# Patient Record
Sex: Female | Born: 1980 | Race: White | Hispanic: No | Marital: Married | Smoking: Never smoker
Health system: Southern US, Community
[De-identification: ages and names within clinical notes are randomized; demographics above are authoritative.]

## PROBLEM LIST (undated history)

## (undated) DIAGNOSIS — C4491 Basal cell carcinoma of skin, unspecified: Secondary | ICD-10-CM

## (undated) HISTORY — PX: MOUTH SURGERY: SHX715

---

## 2013-07-07 ENCOUNTER — Encounter: Payer: Self-pay | Admitting: Emergency Medicine

## 2013-07-07 ENCOUNTER — Emergency Department
Admission: EM | Admit: 2013-07-07 | Discharge: 2013-07-07 | Disposition: A | Discharge status: Home / Self Care | Payer: BC Managed Care – PPO | Source: Home / Self Care | Attending: Emergency Medicine | Admitting: Emergency Medicine

## 2013-07-07 ENCOUNTER — Emergency Department (INDEPENDENT_AMBULATORY_CARE_PROVIDER_SITE_OTHER): Payer: BC Managed Care – PPO

## 2013-07-07 DIAGNOSIS — S338XXA Sprain of other parts of lumbar spine and pelvis, initial encounter: Secondary | ICD-10-CM

## 2013-07-07 DIAGNOSIS — M545 Low back pain, unspecified: Secondary | ICD-10-CM

## 2013-07-07 DIAGNOSIS — S39012A Strain of muscle, fascia and tendon of lower back, initial encounter: Secondary | ICD-10-CM

## 2013-07-07 HISTORY — DX: Basal cell carcinoma of skin, unspecified: C44.91

## 2013-07-07 MED ORDER — MELOXICAM 7.5 MG PO TABS: ORAL_TABLET | ORAL | Status: DC

## 2013-07-07 MED ORDER — CYCLOBENZAPRINE HCL 5 MG PO TABS: ORAL_TABLET | ORAL | Status: DC

## 2013-07-07 NOTE — ED Provider Notes (Signed)
CSN: [id number]     Arrival date & time 07/07/13  1205 History   First MD Initiated Contact with Patient 07/07/13 1212     Chief Complaint  Patient presents with  . Tailbone Pain     (Consider location/radiation/quality/duration/timing/severity/associated sxs/prior Treatment) Patient is a 33 y.o. female presenting with back pain. The history is provided by the patient.  Back Pain Location:  Lumbar spine ("Tailbone") Quality:  Stabbing and shooting Radiates to:  Does not radiate Pain severity now: Varies. Between 2/10, maximum 7/10 with movement, flexion, Onset quality:  Unable to specify Duration:  3 days Timing:  Intermittent Progression:  Worsening Chronicity:  New (However, has had similar intermittent episodes of back pain for the past 6 years, occurring about once a year or so, always resolving within a week with conservative care) Context: not emotional stress, not MVA and not recent illness   Context comment:  She and husband moved to a new house about a month ago, and she had been doing some lifting of boxes Relieved by: rest. Worsened by:  Bending (And weightbearing on left leg) Ineffective treatments: Aleve OTC. Associated symptoms: no abdominal pain, no abdominal swelling, no bladder incontinence, no bowel incontinence, no chest pain, no dysuria, no fever, no headaches, no leg pain, no numbness, no paresthesias, no pelvic pain, no perianal numbness, no tingling, no weakness and no weight loss   Risk factors: no hx of osteoporosis, not pregnant and no recent surgery     Past Medical History  Diagnosis Date  . Basal cell carcinoma    Past Surgical History  Procedure Laterality Date  . Mouth surgery     Family History  Problem Relation Age of Onset  . Cancer Father     skin CA   History  Substance Use Topics  . Smoking status: Never Smoker   . Smokeless tobacco: Never Used  . Alcohol Use: Yes   OB History   Grav Para Term Preterm Abortions TAB SAB Ect Mult  Living                 Review of Systems  Constitutional: Negative for fever and weight loss.  Cardiovascular: Negative for chest pain.  Gastrointestinal: Negative for abdominal pain and bowel incontinence.  Genitourinary: Negative for bladder incontinence, dysuria and pelvic pain.  Musculoskeletal: Positive for back pain.  Neurological: Negative for tingling, weakness, numbness, headaches and paresthesias.  All other systems reviewed and are negative.      Allergies  Review of patient's allergies indicates no known allergies.  Home Medications  No current outpatient prescriptions on file. BP 120/78  Pulse 80  Resp 14  Ht 5\' 5"  (1.651 m)  Wt 144 lb (65.318 kg)  BMI 23.96 kg/m2  SpO2 100%  LMP 07/06/2013 Physical Exam  Nursing note and vitals reviewed. Constitutional: She is oriented to person, place, and time. She appears well-developed and well-nourished. She is cooperative.  Non-toxic appearance. She appears distressed (Appears uncomfortable from low back pain.).  HENT:  Head: Normocephalic and atraumatic.  Mouth/Throat: Oropharynx is clear and moist.  Eyes: EOM are normal. Pupils are equal, round, and reactive to light. No scleral icterus.  Neck: Neck supple.  Cardiovascular: Regular rhythm and normal heart sounds.   Pulmonary/Chest: Effort normal and breath sounds normal. No respiratory distress. She has no wheezes. She has no rales. She exhibits no tenderness.  Abdominal: Soft. There is no tenderness.  Musculoskeletal:       Right hip: Normal.  Left hip: Normal.       Cervical back: She exhibits no tenderness.       Thoracic back: She exhibits no tenderness.       Lumbar back: She exhibits decreased range of motion, tenderness, bony tenderness (L4, L5, S1, and coccyx) and spasm. She exhibits no swelling, no edema, no deformity, no laceration and normal pulse.  Negative Right straight leg-raise test. Positive Left straight leg-raise test at 45  degrees  Negative Right Saralyn Pilar test. Negative Left Saralyn Pilar test.    Neurological: She is alert and oriented to person, place, and time. She has normal strength. She displays no atrophy, no tremor and normal reflexes. No cranial nerve deficit or sensory deficit. She exhibits normal muscle tone. Gait normal.  Reflex Scores:      Patellar reflexes are 2+ on the right side and 2+ on the left side.      Achilles reflexes are 2+ on the right side and 2+ on the left side. Skin: Skin is warm, dry and intact. No lesion and no rash noted.  Psychiatric: She has a normal mood and affect.    ED Course  Procedures (including critical care time) Labs Review Labs Reviewed - No data to display Imaging Review Dg Lumbar Spine Complete  07/07/2013   CLINICAL DATA:  33 year old female with low back pain radiating to the buttocks. Initial encounter.  EXAM: LUMBAR SPINE - COMPLETE 4+ VIEW  COMPARISON:  None.  FINDINGS: Normal lumbar segmentation. Bone mineralization is within normal limits. Normal vertebral height and alignment. Relatively preserved disc spaces. No pars fracture. Visible lower thoracic levels appear intact. sacral ala and SI joints within normal limits.  IMPRESSION: Negative.   Electronically Signed   By: Lars Pinks M.D.   On: 07/07/2013 13:41   Dg Sacrum/coccyx  07/07/2013   CLINICAL DATA:  33 year old female with pain radiating to the buttocks. Initial encounter.  EXAM: SACRUM AND COCCYX - 2+ VIEW  COMPARISON:  Lumbar radiographs from the same day reported separately.  FINDINGS: Bone mineralization is within normal limits. Sacral ala and SI joints appear intact and within normal limits. On the lateral view the sacrum appears intact. Coccygeal segments appear within normal limits. Femoral heads are normally located and both hip joint spaces are preserved. Visible pelvis appears intact  IMPRESSION: Negative.   Electronically Signed   By: Lars Pinks M.D.   On: 07/07/2013 13:42      MDM   Final  diagnoses:  None    Reviewed x-rays lumbar spine and sacrum/coccyx, all within normal limits.--Reviewed in detail with patient. Gave her copies of x-ray reports.  She likely has musculoligamentous strain of LS-spine and coccygeal area. Neurologic exam normal. The only positive finding is positive left straight leg raise. She may also have left radicular pain, although no radiation reported. Treatment options discussed, as well as risks, benefits, alternatives. Patient voiced understanding and agreement with the following plans: Mobic 7.5 mg twice a day Flexeril 5 mg every 8 hours when necessary muscle relaxant Other symptomatic care and nonpharmacologic treatment discussed Followup with PCP or orthopedist if no better one week, sooner if worse or new symptoms. Precautions discussed. Red flags discussed. Questions invited and answered. Patient voiced understanding and agreement.      Jacqulyn Cane, MD 07/07/13 1455

## 2013-07-07 NOTE — ED Notes (Signed)
Shirley Hanson c/o lower back/tailbone pain x 2-3 days. She recently moved. She reports she has pain in the same location intermittent x 6 years, brought on by various activities. Pain does not radiate. Taken Aleve without relief.

## 2013-09-08 LAB — OB RESULTS CONSOLE GC/CHLAMYDIA
CHLAMYDIA, DNA PROBE: NEGATIVE
Chlamydia: NEGATIVE
GC PROBE AMP, GENITAL: NEGATIVE
Gonorrhea: NEGATIVE

## 2013-09-08 LAB — OB RESULTS CONSOLE ABO/RH: RH Type: POSITIVE

## 2013-09-08 LAB — OB RESULTS CONSOLE HEPATITIS B SURFACE ANTIGEN: Hepatitis B Surface Ag: NEGATIVE

## 2013-09-08 LAB — OB RESULTS CONSOLE ANTIBODY SCREEN: ANTIBODY SCREEN: NEGATIVE

## 2013-09-08 LAB — OB RESULTS CONSOLE RPR
RPR: NONREACTIVE
RPR: NONREACTIVE

## 2013-09-08 LAB — OB RESULTS CONSOLE HIV ANTIBODY (ROUTINE TESTING)
HIV: NONREACTIVE
HIV: NONREACTIVE

## 2013-09-08 LAB — OB RESULTS CONSOLE RUBELLA ANTIBODY, IGM: Rubella: IMMUNE

## 2014-03-04 LAB — OB RESULTS CONSOLE GBS: STREP GROUP B AG: NEGATIVE

## 2014-03-27 ENCOUNTER — Inpatient Hospital Stay (HOSPITAL_COMMUNITY)
Admission: AD | Admit: 2014-03-27 | Discharge: 2014-03-29 | DRG: 775 | Disposition: A | Discharge status: Home / Self Care | Payer: BC Managed Care – PPO | Source: Ambulatory Visit | Attending: Obstetrics and Gynecology | Admitting: Obstetrics and Gynecology

## 2014-03-27 ENCOUNTER — Encounter (HOSPITAL_COMMUNITY): Payer: Self-pay

## 2014-03-27 DIAGNOSIS — O09513 Supervision of elderly primigravida, third trimester: Secondary | ICD-10-CM

## 2014-03-27 DIAGNOSIS — O471 False labor at or after 37 completed weeks of gestation: Secondary | ICD-10-CM | POA: Diagnosis present

## 2014-03-27 DIAGNOSIS — O4290 Premature rupture of membranes, unspecified as to length of time between rupture and onset of labor, unspecified weeks of gestation: Secondary | ICD-10-CM | POA: Diagnosis present

## 2014-03-27 DIAGNOSIS — Z3A38 38 weeks gestation of pregnancy: Secondary | ICD-10-CM | POA: Diagnosis present

## 2014-03-27 LAB — ABO/RH: ABO/RH(D): O POS

## 2014-03-27 LAB — CBC
HEMATOCRIT: 38.7 % (ref 36.0–46.0)
Hemoglobin: 13.3 g/dL (ref 12.0–15.0)
MCH: 29.3 pg (ref 26.0–34.0)
MCHC: 34.4 g/dL (ref 30.0–36.0)
MCV: 85.2 fL (ref 78.0–100.0)
Platelets: 181 10*3/uL (ref 150–400)
RBC: 4.54 MIL/uL (ref 3.87–5.11)
RDW: 14 % (ref 11.5–15.5)
WBC: 12.3 10*3/uL — AB (ref 4.0–10.5)

## 2014-03-27 LAB — RPR

## 2014-03-27 LAB — TYPE AND SCREEN
ABO/RH(D): O POS
ANTIBODY SCREEN: NEGATIVE

## 2014-03-27 MED ORDER — PRENATAL MULTIVITAMIN CH
1.0000 | ORAL_TABLET | Freq: Every day | ORAL | Status: DC
Start: 2014-03-28 — End: 2014-03-29
  Administered 2014-03-28 – 2014-03-29 (×2): 1 via ORAL
  Filled 2014-03-27 (×2): qty 1

## 2014-03-27 MED ORDER — FLEET ENEMA 7-19 GM/118ML RE ENEM: 1.0000 | ENEMA | Freq: Every day | RECTAL | Status: DC | PRN

## 2014-03-27 MED ORDER — DIPHENHYDRAMINE HCL 50 MG/ML IJ SOLN: 12.5000 mg | INTRAMUSCULAR | Status: DC | PRN

## 2014-03-27 MED ORDER — ONDANSETRON HCL 4 MG/2ML IJ SOLN: 4.0000 mg | INTRAMUSCULAR | Status: DC | PRN

## 2014-03-27 MED ORDER — WITCH HAZEL-GLYCERIN EX PADS
1.0000 "application " | MEDICATED_PAD | CUTANEOUS | Status: DC | PRN
  Administered 2014-03-28: 1 via TOPICAL

## 2014-03-27 MED ORDER — PHENYLEPHRINE 40 MCG/ML (10ML) SYRINGE FOR IV PUSH (FOR BLOOD PRESSURE SUPPORT)
80.0000 ug | PREFILLED_SYRINGE | INTRAVENOUS | Status: DC | PRN
  Filled 2014-03-27: qty 2
  Filled 2014-03-27: qty 10

## 2014-03-27 MED ORDER — IBUPROFEN 600 MG PO TABS
600.0000 mg | ORAL_TABLET | Freq: Four times a day (QID) | ORAL | Status: DC
  Administered 2014-03-27 – 2014-03-29 (×8): 600 mg via ORAL
  Filled 2014-03-27 (×8): qty 1

## 2014-03-27 MED ORDER — SIMETHICONE 80 MG PO CHEW: 80.0000 mg | CHEWABLE_TABLET | ORAL | Status: DC | PRN

## 2014-03-27 MED ORDER — EPHEDRINE 5 MG/ML INJ
10.0000 mg | INTRAVENOUS | Status: DC | PRN
  Filled 2014-03-27: qty 2

## 2014-03-27 MED ORDER — BENZOCAINE-MENTHOL 20-0.5 % EX AERO
1.0000 "application " | INHALATION_SPRAY | CUTANEOUS | Status: DC | PRN
  Administered 2014-03-27: 1 via TOPICAL
  Filled 2014-03-27: qty 56

## 2014-03-27 MED ORDER — OXYTOCIN BOLUS FROM INFUSION: 500.0000 mL | INTRAVENOUS | Status: DC

## 2014-03-27 MED ORDER — LIDOCAINE HCL (PF) 1 % IJ SOLN
30.0000 mL | INTRAMUSCULAR | Status: AC | PRN
  Administered 2014-03-27: 30 mL via SUBCUTANEOUS
  Filled 2014-03-27: qty 30

## 2014-03-27 MED ORDER — ONDANSETRON HCL 4 MG/2ML IJ SOLN: 4.0000 mg | Freq: Four times a day (QID) | INTRAMUSCULAR | Status: DC | PRN

## 2014-03-27 MED ORDER — ONDANSETRON HCL 4 MG PO TABS: 4.0000 mg | ORAL_TABLET | ORAL | Status: DC | PRN

## 2014-03-27 MED ORDER — OXYCODONE-ACETAMINOPHEN 5-325 MG PO TABS: 1.0000 | ORAL_TABLET | ORAL | Status: DC | PRN

## 2014-03-27 MED ORDER — BISACODYL 10 MG RE SUPP: 10.0000 mg | Freq: Every day | RECTAL | Status: DC | PRN

## 2014-03-27 MED ORDER — TETANUS-DIPHTH-ACELL PERTUSSIS 5-2.5-18.5 LF-MCG/0.5 IM SUSP: 0.5000 mL | Freq: Once | INTRAMUSCULAR | Status: DC

## 2014-03-27 MED ORDER — LACTATED RINGERS IV SOLN: 500.0000 mL | Freq: Once | INTRAVENOUS | Status: DC

## 2014-03-27 MED ORDER — OXYTOCIN 40 UNITS IN LACTATED RINGERS INFUSION - SIMPLE MED
62.5000 mL/h | INTRAVENOUS | Status: DC
  Administered 2014-03-27: 62.5 mL/h via INTRAVENOUS
  Filled 2014-03-27: qty 1000

## 2014-03-27 MED ORDER — SENNOSIDES-DOCUSATE SODIUM 8.6-50 MG PO TABS
2.0000 | ORAL_TABLET | ORAL | Status: DC
  Administered 2014-03-27 – 2014-03-28 (×2): 2 via ORAL
  Filled 2014-03-27 (×2): qty 2

## 2014-03-27 MED ORDER — FENTANYL 2.5 MCG/ML BUPIVACAINE 1/10 % EPIDURAL INFUSION (WH - ANES)
14.0000 mL/h | INTRAMUSCULAR | Status: DC | PRN
  Filled 2014-03-27: qty 125

## 2014-03-27 MED ORDER — ACETAMINOPHEN 325 MG PO TABS: 650.0000 mg | ORAL_TABLET | ORAL | Status: DC | PRN

## 2014-03-27 MED ORDER — PHENYLEPHRINE 40 MCG/ML (10ML) SYRINGE FOR IV PUSH (FOR BLOOD PRESSURE SUPPORT)
80.0000 ug | PREFILLED_SYRINGE | INTRAVENOUS | Status: DC | PRN
  Filled 2014-03-27: qty 2

## 2014-03-27 MED ORDER — LANOLIN HYDROUS EX OINT: TOPICAL_OINTMENT | CUTANEOUS | Status: DC | PRN

## 2014-03-27 MED ORDER — LACTATED RINGERS IV SOLN: 500.0000 mL | INTRAVENOUS | Status: DC | PRN

## 2014-03-27 MED ORDER — LACTATED RINGERS IV SOLN: INTRAVENOUS | Status: DC

## 2014-03-27 MED ORDER — OXYCODONE-ACETAMINOPHEN 5-325 MG PO TABS: 2.0000 | ORAL_TABLET | ORAL | Status: DC | PRN

## 2014-03-27 MED ORDER — ZOLPIDEM TARTRATE 5 MG PO TABS: 5.0000 mg | ORAL_TABLET | Freq: Every evening | ORAL | Status: DC | PRN

## 2014-03-27 MED ORDER — FLEET ENEMA 7-19 GM/118ML RE ENEM: 1.0000 | ENEMA | RECTAL | Status: DC | PRN

## 2014-03-27 MED ORDER — CITRIC ACID-SODIUM CITRATE 334-500 MG/5ML PO SOLN: 30.0000 mL | ORAL | Status: DC | PRN

## 2014-03-27 MED ORDER — DIBUCAINE 1 % RE OINT
1.0000 "application " | TOPICAL_OINTMENT | RECTAL | Status: DC | PRN
  Administered 2014-03-28: 1 via RECTAL
  Filled 2014-03-27: qty 28

## 2014-03-27 MED ORDER — DIPHENHYDRAMINE HCL 25 MG PO CAPS: 25.0000 mg | ORAL_CAPSULE | Freq: Four times a day (QID) | ORAL | Status: DC | PRN

## 2014-03-27 NOTE — Lactation Note (Signed)
This note was copied from the chart of Shirley Izora Benn. Lactation Consultation Note  P1, FOB is [employer] employee.  Baby will be on his insurance. Discussed parent's picking out UMR pump before discharge. Baby sleeping in mother's arms.  Encouraged STS. Reviewed hand expression with mother and discussed cluster feeding. Mom encouraged to feed baby 8-12 times/24 hours and with feeding cues.  Mom made aware of O/P services, breastfeeding support groups, community resources, and our phone # for post-discharge questions.    Patient Name: Shirley Hanson [id number] Date: 03/27/2014 Reason for consult: Initial assessment   Maternal Data Has patient been taught Hand Expression?: Yes Does the patient have breastfeeding experience prior to this delivery?: No  Feeding Feeding Type: Breast Fed Length of feed: 10 min  LATCH Score/Interventions Latch: Too sleepy or reluctant, no latch achieved, no sucking elicited. Intervention(s): Skin to skin  Audible Swallowing: None Intervention(s): Skin to skin  Type of Nipple: Everted at rest and after stimulation  Comfort (Breast/Nipple): Soft / non-tender     Hold (Positioning): Assistance needed to correctly position infant at breast and maintain latch.  LATCH Score: 5  Lactation Tools Discussed/Used     Consult Status Consult Status: Follow-up Date: 03/28/14 Follow-up type: In-patient    Shirley Hanson [facility] 03/27/2014, 4:53 PM

## 2014-03-27 NOTE — H&P (Signed)
Shirley Hanson is a 33 y.o. female presenting for UCs. SROM in MAU, clear. Maternal Medical History:  Reason for admission: Rupture of membranes and contractions.   Contractions: Onset was 3-5 hours ago.    Fetal activity: Perceived fetal activity is normal.      OB History   Grav Para Term Preterm Abortions TAB SAB Ect Mult Living   1              Past Medical History  Diagnosis Date  . Basal cell carcinoma    Past Surgical History  Procedure Laterality Date  . Mouth surgery     Family History: family history includes Cancer in her father. Social History:  reports that she has never smoked. She has never used smokeless tobacco. She reports that she drinks alcohol. She reports that she does not use illicit drugs.   Prenatal Transfer Tool  Maternal Diabetes: No Genetic Screening: Normal Maternal Ultrasounds/Referrals: Normal Fetal Ultrasounds or other Referrals:  None Maternal Substance Abuse:  No Significant Maternal Medications:  None Significant Maternal Lab Results:  None Other Comments:  None  Review of Systems  Eyes: Negative for blurred vision.  Gastrointestinal: Negative for abdominal pain.  Neurological: Negative for headaches.    Dilation: 9 Effacement (%): 90 Station: 0 Blood pressure 97/63, pulse 67, temperature 97.5 F (36.4 C), temperature source Oral, resp. rate 20, height 5\' 5"  (1.651 m), weight 170 lb (77.111 kg), last menstrual period 07/06/2013. Maternal Exam:  Uterine Assessment: Contraction strength is firm.  Contraction frequency is regular.   Abdomen: Patient reports no abdominal tenderness. Fetal presentation: vertex     Fetal Exam Fetal State Assessment: Category I - tracings are normal.     Physical Exam  Cardiovascular: Normal rate and regular rhythm.   Respiratory: Effort normal.  GI: Soft.  Neurological: She has normal reflexes.    Prenatal labs: ABO, Rh: O/Positive/-- (04/14 0000) Antibody: Negative (04/14  0000) Rubella: Immune (04/14 0000) RPR: Nonreactive, Nonreactive (04/14 0000)  HBsAg: Negative (04/14 0000)  HIV: Non-reactive, Non-reactive (04/14 0000)  GBS: Negative (10/08 0000)   Assessment/Plan: 33 yo G1P0 at 38 3/7 weeks in active labor   Valerye Kobus II,Williamson Cavanah E 03/27/2014, 9:34 AM

## 2014-03-27 NOTE — Progress Notes (Signed)
Delivery Note At 12:41 PM a viable female was delivered via  (Presentation: ;  ).  APGAR:8 ,9 ; weight .   Placenta status:intact ,3 vessels .  Cord:  with the following complications: .  Cord pH: pending  Anesthesia:   Episiotomy:none Lacerations:  second degree rt periruethral and left sulcus lacs, both repaired Suture Repair: 2.0 vicryl Est. Blood Loss (mL): 300  Mom to postpartum.  Baby to Couplet care / Skin to Skin.  Cicilia Clinger II,Chee Dimon E 03/27/2014, 12:58 PM

## 2014-03-27 NOTE — Progress Notes (Signed)
Notified pt sent down hall for ROM, gbs negative, was 3cm in office yesterday. Will see pt in room 174

## 2014-03-27 NOTE — MAU Note (Signed)
Pt states ROM at 0630, clear fluid, was 3cm in office yesterday.

## 2014-03-28 LAB — CBC
HCT: 35.8 % — ABNORMAL LOW (ref 36.0–46.0)
Hemoglobin: 12.1 g/dL (ref 12.0–15.0)
MCH: 29.4 pg (ref 26.0–34.0)
MCHC: 33.8 g/dL (ref 30.0–36.0)
MCV: 86.9 fL (ref 78.0–100.0)
PLATELETS: 195 10*3/uL (ref 150–400)
RBC: 4.12 MIL/uL (ref 3.87–5.11)
RDW: 14.2 % (ref 11.5–15.5)
WBC: 17.3 10*3/uL — ABNORMAL HIGH (ref 4.0–10.5)

## 2014-03-28 NOTE — Lactation Note (Signed)
This note was copied from the chart of Shirley Hanson. Lactation Consultation Note Follow up visit at 33 hours.  Mom reports baby is fussy with short frequent feedings.  Discussed cluster feedings and encouraged mom.  Minimal assist with latch on left breast in cross cradle hold, baby latches well with wide flanged lips and rhythmic sucking and few audible swallows then comes off fussy.  Assisted with spoon feeding EBM of about 100mls.  Baby tolerated well, then swaddled and handed to FOB to encourage mom to rest if she can.  Mom denies pain with latch.  Mom to call for assist as needed.    Patient Name: Shirley Hanson YBWLS'L Date: 03/28/2014 Reason for consult: Follow-up assessment   Maternal Data    Feeding Feeding Type: Breast Fed Length of feed:  (few minutes)  LATCH Score/Interventions Latch: Grasps breast easily, tongue down, lips flanged, rhythmical sucking. Intervention(s): Skin to skin;Teach feeding cues;Waking techniques  Audible Swallowing: A few with stimulation Intervention(s): Skin to skin;Hand expression  Type of Nipple: Everted at rest and after stimulation  Comfort (Breast/Nipple): Soft / non-tender     Hold (Positioning): Assistance needed to correctly position infant at breast and maintain latch. Intervention(s): Skin to skin;Position options;Support Pillows;Breastfeeding basics reviewed  LATCH Score: 8  Lactation Tools Discussed/Used     Consult Status Consult Status: Follow-up Date: 03/29/14 Follow-up type: In-patient    Justice Britain 03/28/2014, 10:08 PM

## 2014-03-28 NOTE — Progress Notes (Signed)
Post Partum Day 1 Subjective: no complaints, up ad lib, voiding and tolerating PO  Objective: Blood pressure 128/80, pulse 76, temperature 98.4 F (36.9 C), temperature source Oral, resp. rate 20, height 5\' 5"  (1.651 m), weight 170 lb (77.111 kg), last menstrual period 07/06/2013, unknown if currently breastfeeding.  Physical Exam:  General: alert, cooperative and no distress Lochia: appropriate Uterine Fundus: firm Incision: healing well DVT Evaluation: No evidence of DVT seen on physical exam.   Recent Labs  03/27/14 0920 03/28/14 0607  HGB 13.3 12.1  HCT 38.7 35.8*    Assessment/Plan: Plan for discharge tomorrow   LOS: 1 day   Corley Maffeo II,Shonte Beutler E 03/28/2014, 8:26 AM

## 2014-03-29 ENCOUNTER — Encounter (HOSPITAL_COMMUNITY): Payer: Self-pay

## 2014-03-29 ENCOUNTER — Ambulatory Visit: Payer: Self-pay

## 2014-03-29 LAB — CBC
HCT: 33.2 % — ABNORMAL LOW (ref 36.0–46.0)
Hemoglobin: 11.1 g/dL — ABNORMAL LOW (ref 12.0–15.0)
MCH: 29.4 pg (ref 26.0–34.0)
MCHC: 33.4 g/dL (ref 30.0–36.0)
MCV: 87.8 fL (ref 78.0–100.0)
PLATELETS: 197 10*3/uL (ref 150–400)
RBC: 3.78 MIL/uL — ABNORMAL LOW (ref 3.87–5.11)
RDW: 14.4 % (ref 11.5–15.5)
WBC: 11.7 10*3/uL — AB (ref 4.0–10.5)

## 2014-03-29 LAB — COMPREHENSIVE METABOLIC PANEL
ALT: 20 U/L (ref 0–35)
AST: 25 U/L (ref 0–37)
Albumin: 2.4 g/dL — ABNORMAL LOW (ref 3.5–5.2)
Alkaline Phosphatase: 139 U/L — ABNORMAL HIGH (ref 39–117)
Anion gap: 13 (ref 5–15)
BUN: 9 mg/dL (ref 6–23)
CO2: 22 meq/L (ref 19–32)
CREATININE: 0.51 mg/dL (ref 0.50–1.10)
Calcium: 8.3 mg/dL — ABNORMAL LOW (ref 8.4–10.5)
Chloride: 105 mEq/L (ref 96–112)
GFR calc Af Amer: >90 mL/min (ref >90)
Glucose, Bld: 113 mg/dL — ABNORMAL HIGH (ref 70–99)
POTASSIUM: 3.8 meq/L (ref 3.7–5.3)
Sodium: 140 mEq/L (ref 137–147)
Total Bilirubin: <0.2 mg/dL — ABNORMAL LOW (ref 0.3–1.2)
Total Protein: 5.4 g/dL — ABNORMAL LOW (ref 6.0–8.3)

## 2014-03-29 MED ORDER — IBUPROFEN 600 MG PO TABS: 600.0000 mg | ORAL_TABLET | Freq: Four times a day (QID) | ORAL | Status: DC

## 2014-03-29 NOTE — Discharge Summary (Signed)
Obstetric Discharge Summary Reason for Admission: onset of labor and rupture of membranes Prenatal Procedures: ultrasound Intrapartum Procedures: spontaneous vaginal delivery Postpartum Procedures: none Complications-Operative and Postpartum: right periurethral  laceration HEMOGLOBIN  Date Value Ref Range Status  03/28/2014 12.1 12.0 - 15.0 Hanson/dL Final   HCT  Date Value Ref Range Status  03/28/2014 35.8* 36.0 - 46.0 % Final    Physical Exam:  General: alert and cooperative Lochia: appropriate Uterine Fundus: firm Incision: healing well DVT Evaluation: No evidence of DVT seen on physical exam. Negative Homan's sign. No cords or calf tenderness. No significant calf/ankle edema. DTR's 2+, no clonus  Discharge Diagnoses: Term Pregnancy-delivered  Discharge Information: Date: 03/29/2014 Activity: pelvic rest Diet: routine Medications: PNV and Ibuprofen Condition: stable Instructions: refer to practice specific booklet Discharge to: home. Reviewed PIH symptoms . CBC and CMP drawn prior to discharge   Newborn Data: Live born female  Birth Weight: 7 lb 10.8 oz ([id number] Hanson) APGAR: 8, 9  Home with mother.  Shirley Hanson 03/29/2014, 8:15 AM

## 2014-03-29 NOTE — Lactation Note (Addendum)
This note was copied from the chart of Shirley Hanson. Lactation Consultation Note  Mom latched baby to the breast.  He was very sleepy and needed to be stimulated to suckle.  About 10 minutes into the feeding he became more active though he still needed some stimulation.  Swallows were heard.  Mom reports that this is how the feedings have been.  Suck training was done by the [facility].  Initially he did bit and did not pull my finger deeply into his mouth but after a minute or so I was able to advance my finger back to the hard and soft palate juncture and and he suckled well.  I discussed use of a nipple shield (#20)to see if "extending" the nipple with it would help achieve more suckling.  Baby was tired and not interested.  A #24 was also given to her because her nipple had some swelling when the baby came off.  Mom is able to apply the NS and was taught how to clean it. Plan for discharge is to prepump or hand express to start milk flow so that baby will engage sooner.  Mom may or may not use a NS.  She will post pump for 10 minutes after each feeding and feed any expressed BM back to the baby.  Pediatrician appointment tomorrow and [facility] appointment on Wed.  Patient Name: Shirley Hanson [id number] Date: 03/29/2014     Maternal Data    Feeding Feeding Type: Breast Fed Length of feed: 20 min  LATCH Score/Interventions Latch: Repeated attempts needed to sustain latch, nipple held in mouth throughout feeding, stimulation needed to elicit sucking reflex.  Audible Swallowing: A few with stimulation  Type of Nipple: Everted at rest and after stimulation  Comfort (Breast/Nipple): Soft / non-tender     Hold (Positioning): No assistance needed to correctly position infant at breast.  LATCH Score: 8  Lactation Tools Discussed/Used     Consult Status      Van Clines 03/29/2014, 1:51 PM

## 2014-03-29 NOTE — Lactation Note (Signed)
This note was copied from the chart of Shirley Hanson. Lactation Consultation Note Noted 7 % weight loss, BF well. Noted 11 voids and 11 stools which is more than adequate output for age. Monitoring weight and feedings cont. Patient Name: Shirley Hanson HWYSH'U Date: 03/29/2014 Reason for consult: Infant weight loss;Follow-up assessment   Maternal Data    Feeding Feeding Type: Breast Milk Length of feed:  (few minutes, mom reports cluster feedings previous)  LATCH Score/Interventions Latch: Grasps breast easily, tongue down, lips flanged, rhythmical sucking. Intervention(s): Skin to skin;Teach feeding cues;Waking techniques  Audible Swallowing: A few with stimulation Intervention(s): Skin to skin;Hand expression  Type of Nipple: Everted at rest and after stimulation  Comfort (Breast/Nipple): Soft / non-tender     Hold (Positioning): Assistance needed to correctly position infant at breast and maintain latch. Intervention(s): Skin to skin;Position options;Support Pillows;Breastfeeding basics reviewed  LATCH Score: 8  Lactation Tools Discussed/Used     Consult Status Consult Status: Follow-up Date: 03/29/14 Follow-up type: In-patient    Reann Dobias, Elta Guadeloupe 03/29/2014, 1:48 AM

## 2014-03-31 ENCOUNTER — Ambulatory Visit (HOSPITAL_COMMUNITY)
Admission: AD | Admit: 2014-03-31 | Discharge: 2014-03-31 | Disposition: A | Discharge status: Home / Self Care | Payer: BC Managed Care – PPO | Attending: Obstetrics and Gynecology | Admitting: Obstetrics and Gynecology

## 2014-03-31 NOTE — Lactation Note (Addendum)
Lactation Consult  Mother's reason for visit:  Feeding assessment  Visit Type: feeding assessment  Appointment Notes:   Consult:  Initial Lactation Consultant:  Myer Haff  ________________________________________________________________________ Shirley Hanson Name: Shirley Hanson Date of Birth: 03/27/2016 Pediatrician:Dr. Hope Hanson - [facility]  Gender: female Gestational Age: [redacted]w[redacted]d (At Birth) Birth Weight: 7 lb 10.8 oz (3481 g) Weight at Discharge: Weight: 7 lb 2.3 oz (3240 g)Date of Discharge: 03/29/2014 [facility] Weights   03/27/14 1241 03/28/14 0010 03/28/14 2335  Weight: 7 lb 10.8 oz (3481 g) 7 lb 8.5 oz (3416 g) 7 lb 2.3 oz (3240 g)   Last weight taken from location outside of [facility]:6-13.5 oz Location:Pediatrician's office Weight today:6-13.1 oz 3092 g         ________________________________________________________________________  Mother's Name: Shirley Hanson Type of delivery:  Vaginal Delivery  Breastfeeding Experience:  Baby sleepy in the hospital 1st 2 days , and then the baby was circ , and still went through sleepy time, also jaundice.  Per mom was given a 2 different sized nipple shields the day of discharged and I haven't needed to use them. Shirley Hanson has been latching. But I have been pumping.  Per mom  Jaundice level was done in the Dr. Gabriel Hanson and not high enough to tx with lights.   Maternal Medical Conditions:  Non related to breast  Feeding and milk supply  Maternal Medications:  PNV ,   ________________________________________________________________________  Breastfeeding History (Post Discharge)  Frequency of breastfeeding:  8 X's a day  Duration of feeding:  20 - 30 mins , sometimes sluggish   Pumping - per mom using a DEBP - Medela - pumping after every other feeding for 10 mins with 15 ml off each breast.   Supplementing- per mom supplementing with EBM in a cup after latching at the breast , when available  .   Infant Intake and Output Assessment  Voids: 7-8 , 24 hours Clear yellow Stools:  5 n 24 hrs.  Color:  Meconium and Green  ________________________________________________________________________  Maternal Breast Assessment  Breast:  Full Nipple:  Erect  Pain level:  0 Pain interventions:  Expressed breast milk   Per mom milk came in yesterday Tuesday 11/3 ,   _______________________________________________________________________ Feeding Assessment/Evaluation - Baby jaundice from face down to knees, baby alert , and well hydrated.                                                              Per mom this is is sleepy time a day.   Initial feeding assessment:  Infant's oral assessment:  WNL  Positioning:  Football Right breast  LATCH documentation:  Latch:  2 = Grasps breast easily, tongue down, lips flanged, rhythmical sucking.  Audible swallowing:  2 = Spontaneous and intermittent  Type of nipple:  2 = Everted at rest and after stimulation  Comfort (Breast/Nipple):  1 = Filling, red/small blisters or bruises, mild/mod discomfort ( full , not engorged )   Hold (Positioning):  1 = Assistance needed to correctly position infant at breast and maintain latch ( with depth )   LATCH score:  8   Attached assessment:  Shallow at 1st , and assisted with depth   Lips flanged:  No. ( LC flipped upper lip to flanged position  Lips untucked:  Yes.    Suck assessment:  Nutritive at 1st with swallows and the noted to be sluggish and requiring a lot of stimulation , breast compressions   Tools:  None  Instructed on use and cleaning of tool:  No.  Pre-feed weight:  3092  g  (6  Lb. 13.1  oz.) Post-feed weight:  3098  g (6  lb. 13.3  oz.) Amount transferred:  6 ml Amount supplemented:  None   Additional Feeding Assessment -   Infant's oral assessment:  See above   Positioning:  Football Right breast  Re-latched same breast due to breast feeling still full  LATCH  documentation:  Latch:  2 = Grasps breast easily, tongue down, lips flanged, rhythmical sucking.  Audible swallowing:  2 = Spontaneous and intermittent  Type of nipple:  2 = Everted at rest and after stimulation  Comfort (Breast/Nipple):  1 = Filling, red/small blisters or bruises, mild/mod discomfort ( just full , not engorged )   Hold (Positioning):  1 = Assistance needed to correctly position infant at breast and maintain latch with depth   LATCH score: 8 ,   Attached assessment:  Shallow at 1st , adjusted baby mouth by easing down chin and achieved the depth.   Lips flanged:  Yes.    Lips untucked:  Yes.    Suck assessment:  Nutritive for a short time with swallows , and then noted to stay latched but became non - nutritive,                                    Baby fed for 10 mins, with intermittent stimulation and breast compressions.                                    Even though there were swallows noted , not enough milk transfer to tip the scales.  Tools:  None  Instructed on use and cleaning of tool:  No   Pre-feed weight:  3098 g  (6  lb. 13.3  oz.) Post-feed weight:  3098  g ( 6  lb. 13.3  oz.) Amount transferred:  0  ml Amount supplemented:  None   Additional Feeding assessment:  Left breast   Pre weight - 3098 g, 6-13.3 oz  Post weight - 3118 g 6-14.0 oz  Milk Transferred at this feeding = 20 ml     Total amount pumped post feed:  Mom did not post pump, but has been post pumping at home after every other feeding with a DEBP   Total amount transferred:  26  ml Total supplement given:  Not at this consult ( mom has been supplementing - and knows to continue with EBM form post pumping)   Lactation Impression from consult:  Breast are full , not engorged. Baby alert but sluggish ( may be due to resolving jaundice ) , weight is stabilizing                                                                 Mom independent with latch , just needed assist and instructed  on  use of breast compressions to obtain the depth for more milk transfer.                                                                Mom and dad aware post pumping is important for volume to supplement back to baby for increased calories , therefore enhance jaundice to resolve                                                                 Quicker. ( more voids and stools )> See below for Lactation Plan of care.  Stressed to mom and dad by the end of the 1st week a baby should be taking in at one feeding at least 45 ml!   Lactation Plan of care - Per mom - Smart start - weight check Friday 11/6                                          - Monday Evening 11/9  - LC recommended attending the [facility] for weight check                                          - Wednesday 11/11 - per mom Pedis apt with Dr. Birdie Riddle                                Mom - naps, rest , plenty fluids , especially water , nutritious snacks and meals                               Sore nipple tx - Expressed milk to nipples liberally , cold comfort gels after feedings and pumping                                                        - Shells after comfort gels warm up or 10 1-15 mins prior to latch . ( Don't wear shells when sleeping)                                                        - Olive oil or coconut oil prior to pumping if needed to decrease friction                               Steps for latching -  Breast massage , hand express, prepump with hand pump 7-8 strokes ( prime the milk ducts, 1st breast )                                                                  Latch with firm support ( folded bath towel on top of your boppy at home)                                                                  Breast compressions with latch until "Shirley Hanson " is in a consistent swallowing pattern and then intermittent                               Average feeding time >10 mins  Or greater ,  15 -20 mins                               Wake  Cannon up every 2 1/2 - 3 hours                               Extra pumping - Post pumping using a DEBP both breast for 10 - 15 mins, save milk and supplement it back to [place] with a cup.

## 2014-05-18 LAB — HM PAP SMEAR: HM Pap smear: NORMAL

## 2015-02-11 ENCOUNTER — Ambulatory Visit: Payer: BC Managed Care – PPO | Admitting: Family Medicine

## 2015-05-19 ENCOUNTER — Encounter: Payer: Self-pay | Admitting: Family Medicine

## 2015-05-19 ENCOUNTER — Ambulatory Visit (INDEPENDENT_AMBULATORY_CARE_PROVIDER_SITE_OTHER): Payer: BC Managed Care – PPO | Admitting: Family Medicine

## 2015-05-19 VITALS — BP 124/82 | HR 81 | Temp 98.0°F | Resp 16 | Ht 65.0 in | Wt 144.2 lb

## 2015-05-19 DIAGNOSIS — Z0000 Encounter for general adult medical examination without abnormal findings: Secondary | ICD-10-CM

## 2015-05-19 LAB — CBC WITH DIFFERENTIAL/PLATELET
Basophils Absolute: 0 10*3/uL (ref 0.0–0.1)
Basophils Relative: 0.5 % (ref 0.0–3.0)
Eosinophils Absolute: 0.1 10*3/uL (ref 0.0–0.7)
Eosinophils Relative: 2 % (ref 0.0–5.0)
HCT: 44 % (ref 36.0–46.0)
HEMOGLOBIN: 14.7 g/dL (ref 12.0–15.0)
LYMPHS PCT: 35.1 % (ref 12.0–46.0)
Lymphs Abs: 2.1 10*3/uL (ref 0.7–4.0)
MCHC: 33.3 g/dL (ref 30.0–36.0)
MCV: 87.5 fl (ref 78.0–100.0)
Monocytes Absolute: 0.5 10*3/uL (ref 0.1–1.0)
Monocytes Relative: 8.4 % (ref 3.0–12.0)
Neutro Abs: 3.2 10*3/uL (ref 1.4–7.7)
Neutrophils Relative %: 54 % (ref 43.0–77.0)
PLATELETS: 228 10*3/uL (ref 150.0–400.0)
RBC: 5.03 Mil/uL (ref 3.87–5.11)
RDW: 13.5 % (ref 11.5–15.5)
WBC: 6 10*3/uL (ref 4.0–10.5)

## 2015-05-19 LAB — LIPID PANEL
Cholesterol: 151 mg/dL (ref 0–200)
HDL: 47.8 mg/dL (ref >39.00)
LDL Cholesterol: 90 mg/dL (ref 0–99)
NONHDL: 103.39
Total CHOL/HDL Ratio: 3
Triglycerides: 69 mg/dL (ref 0.0–149.0)
VLDL: 13.8 mg/dL (ref 0.0–40.0)

## 2015-05-19 LAB — BASIC METABOLIC PANEL
BUN: 12 mg/dL (ref 6–23)
CHLORIDE: 104 meq/L (ref 96–112)
CO2: 27 mEq/L (ref 19–32)
CREATININE: 0.54 mg/dL (ref 0.40–1.20)
Calcium: 8.9 mg/dL (ref 8.4–10.5)
GFR: 135.31 mL/min (ref >60.00)
Glucose, Bld: 89 mg/dL (ref 70–99)
Potassium: 4.4 mEq/L (ref 3.5–5.1)
SODIUM: 137 meq/L (ref 135–145)

## 2015-05-19 LAB — HEPATIC FUNCTION PANEL
ALT: 16 U/L (ref 0–35)
AST: 16 U/L (ref 0–37)
Albumin: 4.2 g/dL (ref 3.5–5.2)
Alkaline Phosphatase: 87 U/L (ref 39–117)
BILIRUBIN DIRECT: 0.1 mg/dL (ref 0.0–0.3)
Total Bilirubin: 0.5 mg/dL (ref 0.2–1.2)
Total Protein: 6.9 g/dL (ref 6.0–8.3)

## 2015-05-19 LAB — TSH: TSH: 0.96 u[IU]/mL (ref 0.35–4.50)

## 2015-05-19 LAB — VITAMIN D 25 HYDROXY (VIT D DEFICIENCY, FRACTURES): VITD: 28.52 ng/mL — ABNORMAL LOW (ref 30.00–100.00)

## 2015-05-19 NOTE — Patient Instructions (Signed)
Follow up in 1 year or as needed We'll notify you of your lab results and make any changes if needed Keep up the good work on healthy diet and regular exercise- you look great! Call with any questions or concerns If you want to join [place] at the new [place] office, any scheduled appointments will automatically transfer and we will see you at [address] [place] [address] [place], [place] [address] (OPENING 05/31/15) Happy Holidays!!!

## 2015-05-19 NOTE — Progress Notes (Signed)
   Subjective:    Patient ID: Shirley Hanson, female    DOB: Aug 15, 1980, 34 y.o.   MRN: [id number]  HPI New to establish.  No local PCP previously (moved 3 yrs ago).  OB/GYN- [place]   Review of Systems Patient reports no vision/ hearing changes, adenopathy,fever, weight change,  persistant/recurrent hoarseness , swallowing issues, chest pain, palpitations, edema, persistant/recurrent cough, hemoptysis, dyspnea (rest/exertional/paroxysmal nocturnal), gastrointestinal bleeding (melena, rectal bleeding), abdominal pain, significant heartburn, bowel changes, GU symptoms (dysuria, hematuria, incontinence), Gyn symptoms (abnormal  bleeding, pain),  syncope, focal weakness, memory loss, numbness & tingling, skin/hair/nail changes, abnormal bruising or bleeding, anxiety, or depression.     Objective:   Physical Exam General Appearance:    Alert, cooperative, no distress, appears stated age  Head:    Normocephalic, without obvious abnormality, atraumatic  Eyes:    PERRL, conjunctiva/corneas clear, EOM's intact, fundi    benign, both eyes  Ears:    Normal TM's and external ear canals, both ears  Nose:   Nares normal, septum midline, mucosa normal, no drainage    or sinus tenderness  Throat:   Lips, mucosa, and tongue normal; teeth and gums normal  Neck:   Supple, symmetrical, trachea midline, no adenopathy;    Thyroid: no enlargement/tenderness/nodules  Back:     Symmetric, no curvature, ROM normal, no CVA tenderness  Lungs:     Clear to auscultation bilaterally, respirations unlabored  Chest Wall:    No tenderness or deformity   Heart:    Regular rate and rhythm, S1 and S2 normal, no murmur, rub   or gallop  Breast Exam:    Deferred to GYN  Abdomen:     Soft, non-tender, bowel sounds active all four quadrants,    no masses, no organomegaly  Genitalia:    Deferred to GYN  Rectal:    Extremities:   Extremities normal, atraumatic, no cyanosis or edema  Pulses:   2+ and symmetric all extremities    Skin:   Skin color, texture, turgor normal, no rashes or lesions  Lymph nodes:   Cervical, supraclavicular, and axillary nodes normal  Neurologic:   CNII-XII intact, normal strength, sensation and reflexes    throughout          Assessment & Plan:

## 2015-05-19 NOTE — Addendum Note (Signed)
Addended by: Midge Minium on: 05/19/2015 11:45 AM   Modules accepted: Level of Service

## 2015-05-19 NOTE — Assessment & Plan Note (Signed)
Pt's PE WNL.  UTD on GYN.  Check labs.  Anticipatory guidance provided.  

## 2015-05-19 NOTE — Progress Notes (Signed)
Pre visit review using our clinic review tool, if applicable. No additional management support is needed unless otherwise documented below in the visit note. 

## 2015-05-27 ENCOUNTER — Ambulatory Visit: Payer: BC Managed Care – PPO | Admitting: Family Medicine

## 2015-06-17 ENCOUNTER — Ambulatory Visit (INDEPENDENT_AMBULATORY_CARE_PROVIDER_SITE_OTHER): Payer: BC Managed Care – PPO | Admitting: Family Medicine

## 2015-06-17 ENCOUNTER — Encounter: Payer: Self-pay | Admitting: Family Medicine

## 2015-06-17 VITALS — BP 130/86 | HR 75 | Temp 98.3°F | Resp 16 | Ht 65.0 in | Wt 145.8 lb

## 2015-06-17 DIAGNOSIS — M5432 Sciatica, left side: Secondary | ICD-10-CM

## 2015-06-17 MED ORDER — CYCLOBENZAPRINE HCL 10 MG PO TABS: 10.0000 mg | ORAL_TABLET | Freq: Three times a day (TID) | ORAL | Status: DC | PRN

## 2015-06-17 MED ORDER — PREDNISONE 10 MG PO TABS: ORAL_TABLET | ORAL | Status: DC

## 2015-06-17 MED FILL — CYCLOBENZAPRINE 10 MG TAB: 10 | 10 days supply | Qty: 30 | Fill #0

## 2015-06-17 MED FILL — predniSONE 10 MG TABS: 10 | 9 days supply | Qty: 18 | Fill #0

## 2015-06-17 NOTE — Progress Notes (Signed)
   Subjective:    Patient ID: Shirley Hanson, female    DOB: 1981-05-28, 35 y.o.   MRN: [id number]  HPI LBP- pt has had intermittent L sided LBP for years but typically this only lasts 1-2 days and spontaneously resolves.  Starting ~1 week ago, pain is more severe, longer lasting, radiating into L buttock and leg.  Some numbness in posterior thigh.  No weakness of leg.  Pain improves w/ standing, worse w/ prolonged sitting or lying down.  No fevers.  No bowel or bladder incontinence.  Some improvement w/ 600-800mg  ibuprofen but relief is incomplete and temporary.   Review of Systems For ROS see HPI     Objective:   Physical Exam  Constitutional: She is oriented to person, place, and time. She appears well-developed and well-nourished. No distress.  HENT:  Head: Normocephalic and atraumatic.  Cardiovascular: Intact distal pulses.   Musculoskeletal: She exhibits tenderness (TTP over L sciatic notch). She exhibits no edema.  Neurological: She is alert and oriented to person, place, and time. She has normal reflexes.  (+) SLR on L, (-) on R  Skin: Skin is warm and dry.  Psychiatric: She has a normal mood and affect. Her behavior is normal. Thought content normal.  Vitals reviewed.         Assessment & Plan:

## 2015-06-17 NOTE — Patient Instructions (Signed)
Follow up as needed Start the Prednisone as directed- take w/ food Avoid other anti-inflammatories while on the prednisone but tylenol is fine Ice or heat- whichever feels better Use the flexeril for nights/weekends- will cause drowsiness If no improvement in the next week- call me! Call with any questions or concerns Hang in there!!!

## 2015-06-17 NOTE — Progress Notes (Signed)
Pre visit review using our clinic review tool, if applicable. No additional management support is needed unless otherwise documented below in the visit note. 

## 2015-06-19 NOTE — Assessment & Plan Note (Signed)
New.  Pt's sxs and PE consistent w/ sciatica.  No red flags on hx or PE.  Start steroid taper.  Muscle relaxer prn.  Pt expressed understanding and is in agreement w/ plan.

## 2015-09-12 IMAGING — CR DG SACRUM/COCCYX 2+V
3 series · 3 of 3 positions shown · non-contrast
Comparison: Lumbar radiographs from the same day reported
separately.

CLINICAL DATA: 32-year-old female with pain radiating to the
buttocks. Initial encounter.

EXAM:
SACRUM AND COCCYX - 2+ VIEW

[view not recorded (1 of 3)]
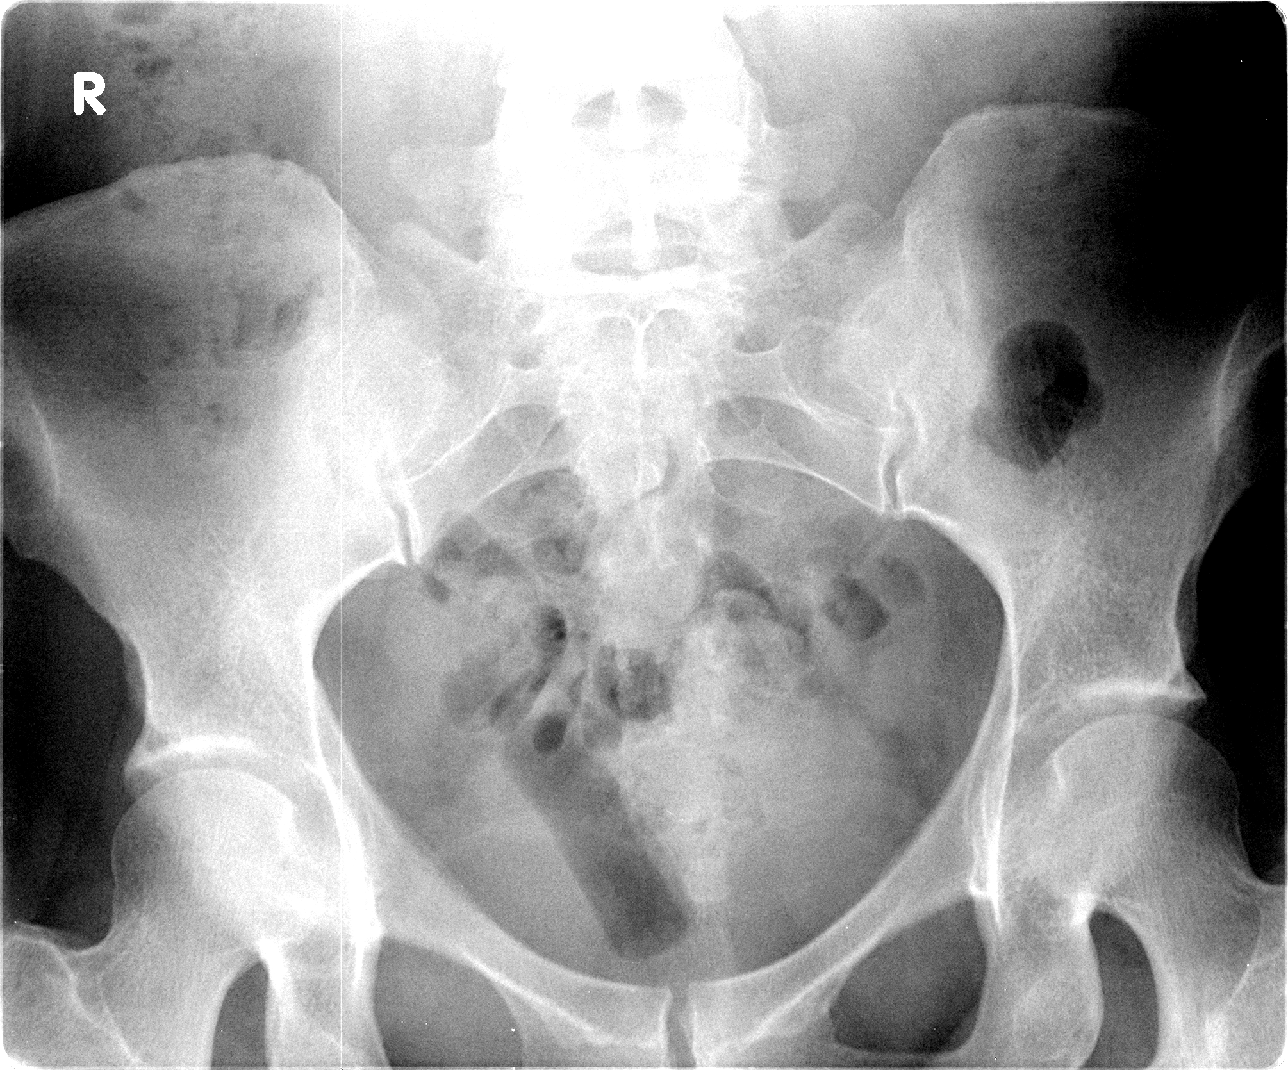

[view not recorded (2 of 3)]
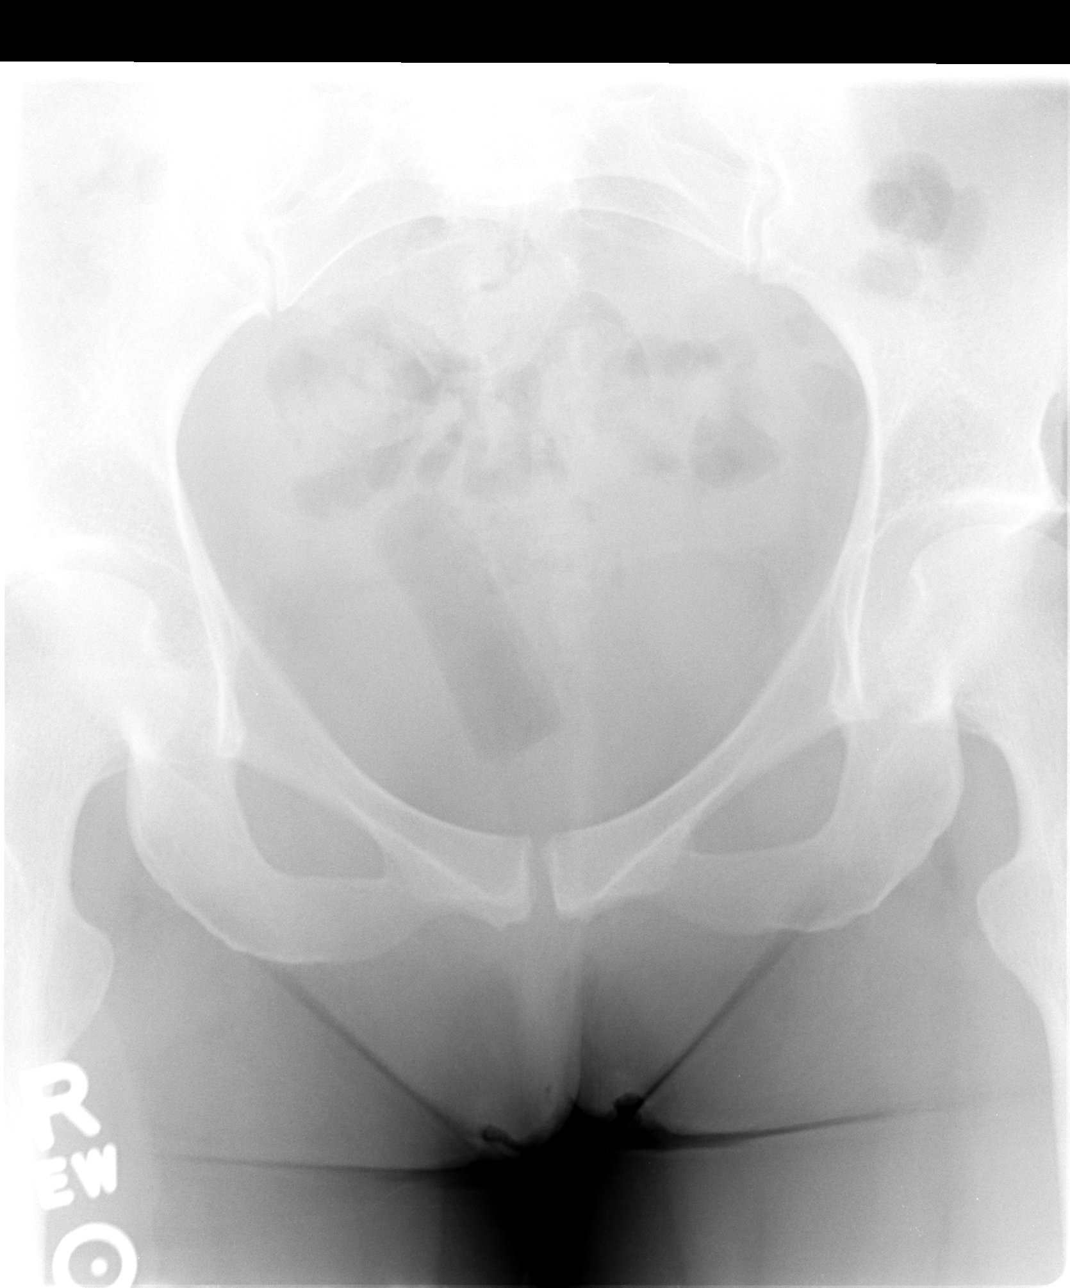

[view not recorded (3 of 3)]
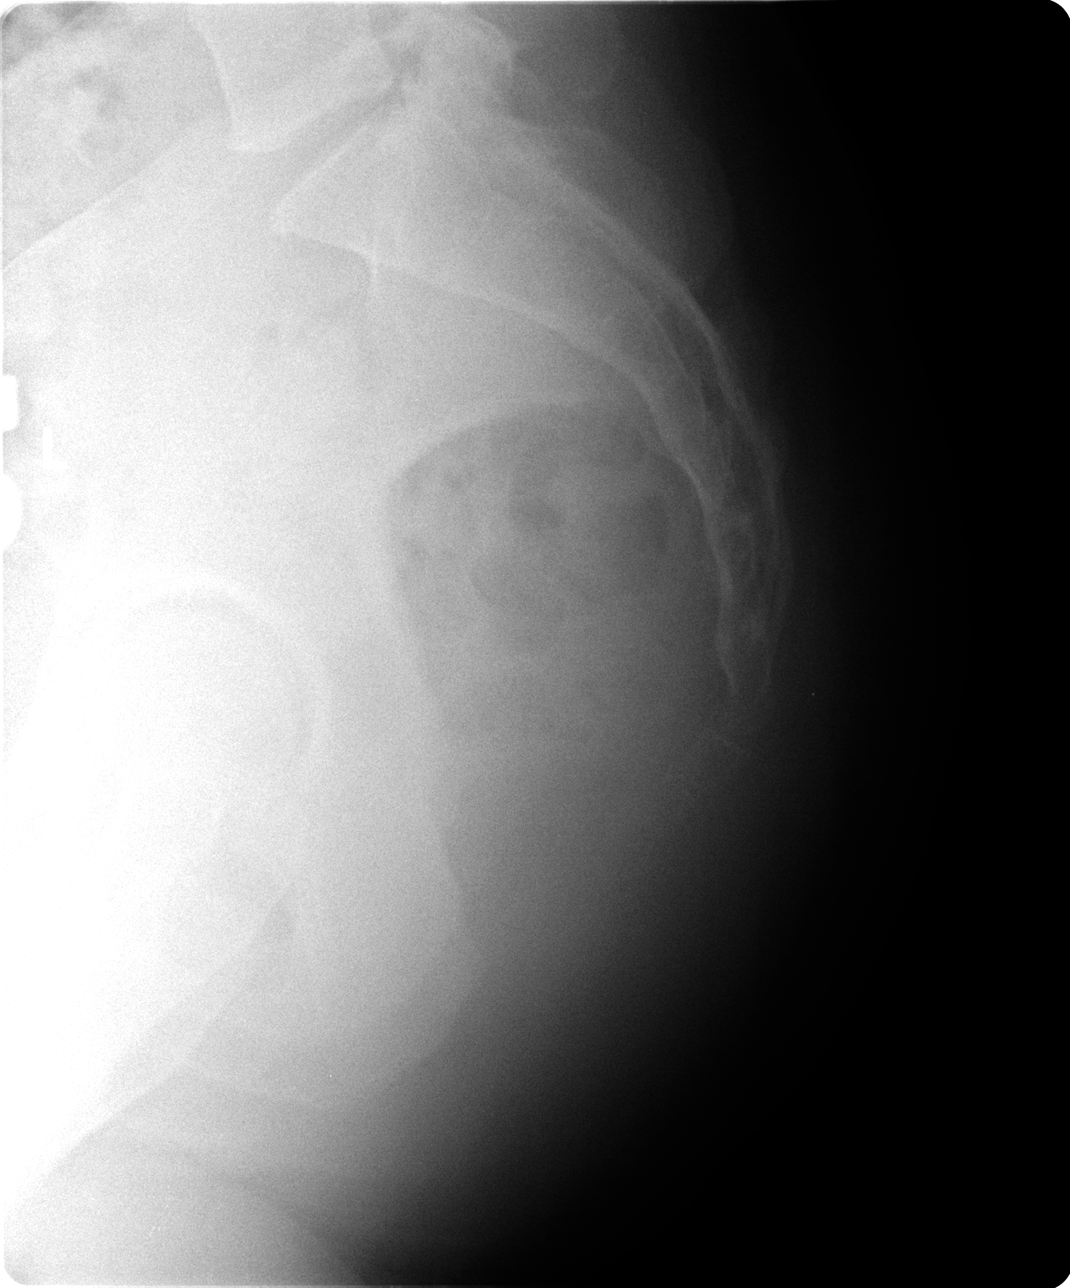

[3 of 3 positions shown; findings below may reference images not displayed]

FINDINGS: Bone mineralization is within normal limits. Sacral ala and SI
joints appear intact and within normal limits. On the lateral view
the sacrum appears intact. Coccygeal segments appear within normal
limits. Femoral heads are normally located and both hip joint spaces
are preserved. Visible pelvis appears intact
IMPRESSION: Negative.

## 2016-03-26 MED FILL — ZYLET EYE DROPS: 0.5-0.3 | 6 days supply | Qty: 5 | Fill #0

## 2016-05-23 ENCOUNTER — Encounter: Payer: BC Managed Care – PPO | Admitting: Family Medicine

## 2016-06-27 LAB — HM PAP SMEAR

## 2016-08-20 ENCOUNTER — Encounter: Payer: Self-pay | Admitting: Family Medicine

## 2016-08-20 ENCOUNTER — Ambulatory Visit (INDEPENDENT_AMBULATORY_CARE_PROVIDER_SITE_OTHER): Payer: BC Managed Care – PPO | Admitting: Family Medicine

## 2016-08-20 VITALS — BP 121/73 | HR 65 | Temp 98.1°F | Resp 16 | Ht 65.0 in | Wt 144.1 lb

## 2016-08-20 DIAGNOSIS — Z0000 Encounter for general adult medical examination without abnormal findings: Secondary | ICD-10-CM

## 2016-08-20 LAB — BASIC METABOLIC PANEL
BUN: 12 mg/dL (ref 7–25)
CHLORIDE: 103 mmol/L (ref 98–110)
CO2: 24 mmol/L (ref 20–31)
Calcium: 9.2 mg/dL (ref 8.6–10.2)
Creat: 0.64 mg/dL (ref 0.50–1.10)
GLUCOSE: 92 mg/dL (ref 65–99)
Potassium: 4.5 mmol/L (ref 3.5–5.3)
SODIUM: 137 mmol/L (ref 135–146)

## 2016-08-20 LAB — TSH: TSH: 1.08 mIU/L

## 2016-08-20 LAB — CBC WITH DIFFERENTIAL/PLATELET
BASOS PCT: 1 %
Basophils Absolute: 73 cells/uL (ref 0–200)
EOS ABS: 73 {cells}/uL (ref 15–500)
Eosinophils Relative: 1 %
HCT: 42.3 % (ref 35.0–45.0)
Hemoglobin: 14.1 g/dL (ref 11.7–15.5)
Lymphocytes Relative: 34 %
Lymphs Abs: 2482 cells/uL (ref 850–3900)
MCH: 28.8 pg (ref 27.0–33.0)
MCHC: 33.3 g/dL (ref 32.0–36.0)
MCV: 86.3 fL (ref 80.0–100.0)
MPV: 10 fL (ref 7.5–12.5)
Monocytes Absolute: 657 cells/uL (ref 200–950)
Monocytes Relative: 9 %
Neutro Abs: 4015 cells/uL (ref 1500–7800)
Neutrophils Relative %: 55 %
Platelets: 291 10*3/uL (ref 140–400)
RBC: 4.9 MIL/uL (ref 3.80–5.10)
RDW: 13.5 % (ref 11.0–15.0)
WBC: 7.3 10*3/uL (ref 3.8–10.8)

## 2016-08-20 LAB — LIPID PANEL
CHOLESTEROL: 140 mg/dL (ref <200)
HDL: 34 mg/dL — ABNORMAL LOW (ref >50)
LDL Cholesterol: 91 mg/dL (ref <100)
Total CHOL/HDL Ratio: 4.1 Ratio (ref <5.0)
Triglycerides: 74 mg/dL (ref <150)
VLDL: 15 mg/dL (ref <30)

## 2016-08-20 LAB — HEPATIC FUNCTION PANEL
ALK PHOS: 85 U/L (ref 33–115)
ALT: 17 U/L (ref 6–29)
AST: 16 U/L (ref 10–30)
Albumin: 4.1 g/dL (ref 3.6–5.1)
BILIRUBIN TOTAL: 0.3 mg/dL (ref 0.2–1.2)
Bilirubin, Direct: 0.1 mg/dL (ref <=0.2)
Indirect Bilirubin: 0.2 mg/dL (ref 0.2–1.2)
Total Protein: 7 g/dL (ref 6.1–8.1)

## 2016-08-20 NOTE — Patient Instructions (Signed)
Follow up in 1 year or as needed We'll notify you of your lab results and make any changes if needed Keep up the good work on healthy diet and regular exercise- you look great!!! Call with any questions or concerns [place]!!  Have a safe trip!!!

## 2016-08-20 NOTE — Assessment & Plan Note (Signed)
Pt's PE WNL.  UTD on GYN.  Check labs.  Anticipatory guidance provided.  

## 2016-08-20 NOTE — Progress Notes (Signed)
   Subjective:    Patient ID: Shirley Hanson, female    DOB: 10-19-1980, 36 y.o.   MRN: [id number]  HPI CPE- UTD on pap (GYN).  Exercising regularly.  UTD on Tdap.     Review of Systems Patient reports no vision/ hearing changes, adenopathy,fever, weight change,  persistant/recurrent hoarseness , swallowing issues, chest pain, palpitations, edema, persistant/recurrent cough, hemoptysis, dyspnea (rest/exertional/paroxysmal nocturnal), gastrointestinal bleeding (melena, rectal bleeding), abdominal pain, significant heartburn, bowel changes, GU symptoms (dysuria, hematuria, incontinence), Gyn symptoms (abnormal  bleeding, pain),  syncope, focal weakness, memory loss, numbness & tingling, skin/hair/nail changes, abnormal bruising or bleeding, anxiety, or depression.     Objective:   Physical Exam General Appearance:    Alert, cooperative, no distress, appears stated age  Head:    Normocephalic, without obvious abnormality, atraumatic  Eyes:    PERRL, conjunctiva/corneas clear, EOM's intact, fundi    benign, both eyes  Ears:    Normal TM's and external ear canals, both ears  Nose:   Nares normal, septum midline, mucosa normal, no drainage    or sinus tenderness  Throat:   Lips, mucosa, and tongue normal; teeth and gums normal  Neck:   Supple, symmetrical, trachea midline, no adenopathy;    Thyroid: no enlargement/tenderness/nodules  Back:     Symmetric, no curvature, ROM normal, no CVA tenderness  Lungs:     Clear to auscultation bilaterally, respirations unlabored  Chest Wall:    No tenderness or deformity   Heart:    Regular rate and rhythm, S1 and S2 normal, no murmur, rub   or gallop  Breast Exam:    Deferred to GYN  Abdomen:     Soft, non-tender, bowel sounds active all four quadrants,    no masses, no organomegaly  Genitalia:    Deferred to GYN  Rectal:    Extremities:   Extremities normal, atraumatic, no cyanosis or edema  Pulses:   2+ and symmetric all extremities  Skin:   Skin  color, texture, turgor normal, no rashes or lesions  Lymph nodes:   Cervical, supraclavicular, and axillary nodes normal  Neurologic:   CNII-XII intact, normal strength, sensation and reflexes    throughout          Assessment & Plan:

## 2016-08-20 NOTE — Progress Notes (Signed)
Pre visit review using our clinic review tool, if applicable. No additional management support is needed unless otherwise documented below in the visit note. 

## 2016-08-21 LAB — VITAMIN D 25 HYDROXY (VIT D DEFICIENCY, FRACTURES): Vit D, 25-Hydroxy: 35 ng/mL (ref 30–100)

## 2016-09-27 MED FILL — CEPHALEXIN 500 MG CAPSULE: 500 | 1 days supply | Qty: 2 | Fill #0

## 2016-10-01 ENCOUNTER — Encounter: Payer: Self-pay | Admitting: Family Medicine

## 2017-01-21 MED FILL — miSOPROStol 200 MCG TABS: 200 | 1 days supply | Qty: 4 | Fill #0

## 2017-02-25 ENCOUNTER — Telehealth: Payer: BC Managed Care – PPO | Admitting: Family

## 2017-02-25 DIAGNOSIS — R0602 Shortness of breath: Secondary | ICD-10-CM

## 2017-02-25 DIAGNOSIS — R531 Weakness: Secondary | ICD-10-CM

## 2017-02-25 NOTE — Progress Notes (Signed)
Based on what you shared with me it looks like you have a serious condition that should be evaluated in a face to face office visit.  NOTE: Even if you have entered your credit card information for this eVisit, you will not be charged.   If you are having a true medical emergency please call 911.  If you need an urgent face to face visit, [facility] has four urgent care centers for your convenience.  If you need care fast and have a high deductible or no insurance consider:   [email or web address]  [phone]  [address], [address] [place], [place] [address] 8 am to 8 pm Monday-Friday 10 am to 4 pm Saturday-Sunday   The following sites will take your  insurance:    . [facility]  [phone] Get Driving Directions Find a Provider at this Location  [address] [place], [place] [address] . 10 am to 8 pm Monday-Friday . 12 pm to 8 pm Saturday-Sunday   . [facility]  [phone] Get Driving Directions Find a Provider at this Location  [address], [address] [place], [place] [address] . 8 am to 8 pm Monday-Friday . 9 am to 6 pm Saturday . 11 am to 6 pm Sunday   . [facility] [place]  [phone] Get Driving Directions  [address] [place], [place] [address] . 8 am to 8 pm Monday-Friday . 8 am to 4 pm Saturday-Sunday   Your e-visit answers were reviewed by a board certified advanced clinical practitioner to complete your personal care plan.  Thank you for using e-Visits.  

## 2017-02-26 ENCOUNTER — Ambulatory Visit (INDEPENDENT_AMBULATORY_CARE_PROVIDER_SITE_OTHER): Payer: BC Managed Care – PPO | Admitting: Physician Assistant

## 2017-02-26 ENCOUNTER — Encounter: Payer: Self-pay | Admitting: Physician Assistant

## 2017-02-26 VITALS — BP 102/78 | HR 76 | Temp 99.0°F | Resp 14 | Ht 65.0 in | Wt 144.0 lb

## 2017-02-26 DIAGNOSIS — J029 Acute pharyngitis, unspecified: Secondary | ICD-10-CM

## 2017-02-26 LAB — POCT RAPID STREP A (OFFICE): Rapid Strep A Screen: NEGATIVE

## 2017-02-26 NOTE — Patient Instructions (Signed)
Please stay well-hydrated and get plenty of rest. The quick strep test is negative. We are sending for a culture just to make sure.   Alternate tylenol and ibuprofen for throat pain and fevers. Start salt-water gargles. Get some saline nasal spray to help with nasal drainage. Symptoms seem most likely viral in nature.  As such they should subside over the next few days.

## 2017-02-26 NOTE — Progress Notes (Signed)
Pre visit review using our clinic review tool, if applicable. No additional management support is needed unless otherwise documented below in the visit note. 

## 2017-02-26 NOTE — Progress Notes (Signed)
   Patient presents to clinic today c/o sore throat, aching x 1 day. Noted some chills, fever the day before that have resolved. Symptoms are worse in the morning and somewhat better as the day progresses. Symptoms are improved with Ibuprofen. Denies chest congestion or nasal congestion. Denies chest pain, SOB. Husband and son with similar symptoms. She also works as a Pharmacist, hospital.  Past Medical History:  Diagnosis Date  . Basal cell carcinoma     Current Outpatient Prescriptions on File Prior to Visit  Medication Sig Dispense Refill  . ibuprofen (ADVIL,MOTRIN) 600 MG tablet Take 1 tablet (600 mg total) by mouth every 6 (six) hours. 30 tablet 1  . Prenatal Vit-Fe Fumarate-FA (PRENATAL MULTIVITAMIN) TABS tablet Take 1 tablet by mouth daily at 12 noon.    . Salicylic Acid 2 % LIQD Apply 1 application topically every morning. Application to face     No current facility-administered medications on file prior to visit.     No Known Allergies  Family History  Problem Relation Age of Onset  . Cancer Father        skin CA  . Hypertension Father   . Heart attack Paternal Grandfather     Social History   Social History  . Marital status: Married    Spouse name: N/A  . Number of children: N/A  . Years of education: N/A   Social History Main Topics  . Smoking status: Never Smoker  . Smokeless tobacco: Never Used  . Alcohol use Yes  . Drug use: No  . Sexual activity: Yes   Other Topics Concern  . None   Social History Narrative  . None   Review of Systems - See HPI.  All other ROS are negative.  BP 102/78   Pulse 76   Temp 99 F (37.2 C) (Oral)   Resp 14   Ht 5\' 5"  (1.651 m)   Wt 144 lb (65.3 kg)   SpO2 99%   BMI 23.96 kg/m   Physical Exam  Constitutional: She is well-developed, well-nourished, and in no distress.  HENT:  Head: Normocephalic and atraumatic.  Eyes: Pupils are equal, round, and reactive to light. Conjunctivae and EOM are normal.  Neck: Neck supple.    Cardiovascular: Normal rate, regular rhythm, normal heart sounds and intact distal pulses.   Pulmonary/Chest: Effort normal and breath sounds normal. No respiratory distress. She has no wheezes. She has no rales. She exhibits no tenderness.  Neurological: She is alert.  Skin: Skin is warm and dry. No rash noted.  Psychiatric: Affect normal.  Vitals reviewed.  Assessment/Plan: 1. Sore throat Seems viral. Rapid strep negative. Will send for culture to r/o strep giving fever and odynophagia in absence of other URI symptoms. Start OTC analgesics/antipyretics. Supportive measures reviewed. - POCT rapid strep A - Culture, Group A Strep   Leeanne Rio, PA-C

## 2017-03-01 ENCOUNTER — Other Ambulatory Visit: Payer: Self-pay | Admitting: Physician Assistant

## 2017-03-01 DIAGNOSIS — J020 Streptococcal pharyngitis: Secondary | ICD-10-CM

## 2017-03-01 LAB — CULTURE, GROUP A STREP
MICRO NUMBER: 81092767
SPECIMEN QUALITY:: ADEQUATE

## 2017-03-01 MED ORDER — AMOXICILLIN 500 MG PO CAPS: 500.0000 mg | ORAL_CAPSULE | Freq: Two times a day (BID) | ORAL | 0 refills | Status: AC

## 2017-03-01 MED FILL — AMOXICILLIN 500 MG CAPSULE: 500 | 10 days supply | Qty: 20 | Fill #0

## 2017-05-03 MED FILL — PROGESTERONE 200 MG CAPSULE: 200 | 7 days supply | Qty: 7 | Fill #0

## 2017-05-28 NOTE — L&D Delivery Note (Signed)
Delivery Note  SVD viable female Apgars 8,9 over intact perineum.  Placenta delivered spontaneously intact with 3VC. Good support and hemostasis noted.  R/V exam confirms.  PH art was not done.   Mother and baby to couplet care and are doing well.  EBL 300cc  Louretta Shorten, MD

## 2017-07-25 LAB — OB RESULTS CONSOLE RPR: RPR: NONREACTIVE

## 2017-07-25 LAB — OB RESULTS CONSOLE HIV ANTIBODY (ROUTINE TESTING): HIV: NONREACTIVE

## 2017-07-25 LAB — OB RESULTS CONSOLE GC/CHLAMYDIA
CHLAMYDIA, DNA PROBE: NEGATIVE
Gonorrhea: NEGATIVE

## 2017-07-25 LAB — OB RESULTS CONSOLE HEPATITIS B SURFACE ANTIGEN: HEP B S AG: NEGATIVE

## 2017-07-25 LAB — OB RESULTS CONSOLE RUBELLA ANTIBODY, IGM: Rubella: IMMUNE

## 2017-08-22 ENCOUNTER — Encounter: Payer: BC Managed Care – PPO | Admitting: Family Medicine

## 2018-02-11 ENCOUNTER — Inpatient Hospital Stay (HOSPITAL_COMMUNITY): Payer: BC Managed Care – PPO | Admitting: Anesthesiology

## 2018-02-11 ENCOUNTER — Encounter (HOSPITAL_COMMUNITY): Payer: Self-pay

## 2018-02-11 ENCOUNTER — Inpatient Hospital Stay (HOSPITAL_COMMUNITY)
Admission: AD | Admit: 2018-02-11 | Discharge: 2018-02-12 | DRG: 807 | Disposition: A | Discharge status: Home / Self Care | Payer: BC Managed Care – PPO | Attending: Obstetrics and Gynecology | Admitting: Obstetrics and Gynecology

## 2018-02-11 DIAGNOSIS — Z23 Encounter for immunization: Secondary | ICD-10-CM | POA: Diagnosis not present

## 2018-02-11 DIAGNOSIS — O4292 Full-term premature rupture of membranes, unspecified as to length of time between rupture and onset of labor: Secondary | ICD-10-CM | POA: Diagnosis present

## 2018-02-11 DIAGNOSIS — Z3A37 37 weeks gestation of pregnancy: Secondary | ICD-10-CM

## 2018-02-11 DIAGNOSIS — O4290 Premature rupture of membranes, unspecified as to length of time between rupture and onset of labor, unspecified weeks of gestation: Secondary | ICD-10-CM | POA: Diagnosis present

## 2018-02-11 LAB — CBC
HEMATOCRIT: 36.4 % (ref 36.0–46.0)
Hemoglobin: 12.2 g/dL (ref 12.0–15.0)
MCH: 28.9 pg (ref 26.0–34.0)
MCHC: 33.5 g/dL (ref 30.0–36.0)
MCV: 86.3 fL (ref 78.0–100.0)
PLATELETS: 185 10*3/uL (ref 150–400)
RBC: 4.22 MIL/uL (ref 3.87–5.11)
RDW: 14.1 % (ref 11.5–15.5)
WBC: 6.9 10*3/uL (ref 4.0–10.5)

## 2018-02-11 LAB — TYPE AND SCREEN
ABO/RH(D): O POS
Antibody Screen: NEGATIVE

## 2018-02-11 LAB — RPR: RPR Ser Ql: NONREACTIVE

## 2018-02-11 LAB — POCT FERN TEST: POCT Fern Test: POSITIVE — AB

## 2018-02-11 MED ORDER — LACTATED RINGERS IV SOLN: 500.0000 mL | INTRAVENOUS | Status: DC | PRN

## 2018-02-11 MED ORDER — ONDANSETRON HCL 4 MG/2ML IJ SOLN: 4.0000 mg | Freq: Four times a day (QID) | INTRAMUSCULAR | Status: DC | PRN

## 2018-02-11 MED ORDER — DIBUCAINE 1 % RE OINT
1.0000 "application " | TOPICAL_OINTMENT | RECTAL | Status: DC | PRN
  Administered 2018-02-11: 1 via RECTAL
  Filled 2018-02-11: qty 28

## 2018-02-11 MED ORDER — BENZOCAINE-MENTHOL 20-0.5 % EX AERO
1.0000 "application " | INHALATION_SPRAY | CUTANEOUS | Status: DC | PRN
  Administered 2018-02-11: 1 via TOPICAL
  Filled 2018-02-11: qty 56

## 2018-02-11 MED ORDER — OXYCODONE-ACETAMINOPHEN 5-325 MG PO TABS: 1.0000 | ORAL_TABLET | ORAL | Status: DC | PRN

## 2018-02-11 MED ORDER — EPHEDRINE 5 MG/ML INJ
10.0000 mg | INTRAVENOUS | Status: DC | PRN
  Filled 2018-02-11: qty 2

## 2018-02-11 MED ORDER — WITCH HAZEL-GLYCERIN EX PADS
1.0000 "application " | MEDICATED_PAD | CUTANEOUS | Status: DC | PRN
  Administered 2018-02-11: 1 via TOPICAL

## 2018-02-11 MED ORDER — MEASLES, MUMPS & RUBELLA VAC ~~LOC~~ INJ
0.5000 mL | INJECTION | Freq: Once | SUBCUTANEOUS | Status: DC
  Filled 2018-02-11: qty 0.5

## 2018-02-11 MED ORDER — INFLUENZA VAC SPLIT QUAD 0.5 ML IM SUSY
0.5000 mL | PREFILLED_SYRINGE | INTRAMUSCULAR | Status: AC
  Administered 2018-02-12: 0.5 mL via INTRAMUSCULAR

## 2018-02-11 MED ORDER — OXYTOCIN 40 UNITS IN LACTATED RINGERS INFUSION - SIMPLE MED
2.5000 [IU]/h | INTRAVENOUS | Status: DC
  Administered 2018-02-11: 2.5 [IU]/h via INTRAVENOUS

## 2018-02-11 MED ORDER — ONDANSETRON HCL 4 MG/2ML IJ SOLN: 4.0000 mg | INTRAMUSCULAR | Status: DC | PRN

## 2018-02-11 MED ORDER — MEDROXYPROGESTERONE ACETATE 150 MG/ML IM SUSP: 150.0000 mg | INTRAMUSCULAR | Status: DC | PRN

## 2018-02-11 MED ORDER — IBUPROFEN 600 MG PO TABS
600.0000 mg | ORAL_TABLET | Freq: Four times a day (QID) | ORAL | Status: DC
  Administered 2018-02-11 – 2018-02-12 (×5): 600 mg via ORAL
  Filled 2018-02-11 (×5): qty 1

## 2018-02-11 MED ORDER — PHENYLEPHRINE 40 MCG/ML (10ML) SYRINGE FOR IV PUSH (FOR BLOOD PRESSURE SUPPORT)
80.0000 ug | PREFILLED_SYRINGE | INTRAVENOUS | Status: DC | PRN
  Filled 2018-02-11: qty 5

## 2018-02-11 MED ORDER — FENTANYL 2.5 MCG/ML BUPIVACAINE 1/10 % EPIDURAL INFUSION (WH - ANES)
14.0000 mL/h | INTRAMUSCULAR | Status: DC | PRN
  Filled 2018-02-11: qty 100

## 2018-02-11 MED ORDER — PRENATAL MULTIVITAMIN CH
1.0000 | ORAL_TABLET | Freq: Every day | ORAL | Status: DC
  Administered 2018-02-11 – 2018-02-12 (×2): 1 via ORAL
  Filled 2018-02-11 (×2): qty 1

## 2018-02-11 MED ORDER — HYDROXYZINE HCL 50 MG PO TABS
50.0000 mg | ORAL_TABLET | Freq: Four times a day (QID) | ORAL | Status: DC | PRN
  Filled 2018-02-11: qty 1

## 2018-02-11 MED ORDER — DIPHENHYDRAMINE HCL 25 MG PO CAPS: 25.0000 mg | ORAL_CAPSULE | Freq: Four times a day (QID) | ORAL | Status: DC | PRN

## 2018-02-11 MED ORDER — ONDANSETRON HCL 4 MG PO TABS: 4.0000 mg | ORAL_TABLET | ORAL | Status: DC | PRN

## 2018-02-11 MED ORDER — LACTATED RINGERS IV SOLN: 500.0000 mL | Freq: Once | INTRAVENOUS | Status: DC

## 2018-02-11 MED ORDER — OXYTOCIN 40 UNITS IN LACTATED RINGERS INFUSION - SIMPLE MED
1.0000 m[IU]/min | INTRAVENOUS | Status: DC
  Administered 2018-02-11: 2 m[IU]/min via INTRAVENOUS
  Filled 2018-02-11: qty 1000

## 2018-02-11 MED ORDER — TETANUS-DIPHTH-ACELL PERTUSSIS 5-2.5-18.5 LF-MCG/0.5 IM SUSP: 0.5000 mL | Freq: Once | INTRAMUSCULAR | Status: DC

## 2018-02-11 MED ORDER — SIMETHICONE 80 MG PO CHEW: 80.0000 mg | CHEWABLE_TABLET | ORAL | Status: DC | PRN

## 2018-02-11 MED ORDER — LIDOCAINE HCL (PF) 1 % IJ SOLN
30.0000 mL | INTRAMUSCULAR | Status: DC | PRN
  Administered 2018-02-11: 30 mL via SUBCUTANEOUS
  Filled 2018-02-11: qty 30

## 2018-02-11 MED ORDER — FLEET ENEMA 7-19 GM/118ML RE ENEM: 1.0000 | ENEMA | RECTAL | Status: DC | PRN

## 2018-02-11 MED ORDER — SENNOSIDES-DOCUSATE SODIUM 8.6-50 MG PO TABS
2.0000 | ORAL_TABLET | ORAL | Status: DC
  Administered 2018-02-11: 2 via ORAL
  Filled 2018-02-11: qty 2

## 2018-02-11 MED ORDER — OXYTOCIN BOLUS FROM INFUSION
500.0000 mL | Freq: Once | INTRAVENOUS | Status: AC
  Administered 2018-02-11: 500 mL via INTRAVENOUS

## 2018-02-11 MED ORDER — OXYCODONE-ACETAMINOPHEN 5-325 MG PO TABS: 2.0000 | ORAL_TABLET | ORAL | Status: DC | PRN

## 2018-02-11 MED ORDER — PHENYLEPHRINE 40 MCG/ML (10ML) SYRINGE FOR IV PUSH (FOR BLOOD PRESSURE SUPPORT)
80.0000 ug | PREFILLED_SYRINGE | INTRAVENOUS | Status: DC | PRN
  Filled 2018-02-11: qty 5
  Filled 2018-02-11: qty 10

## 2018-02-11 MED ORDER — ACETAMINOPHEN 325 MG PO TABS: 650.0000 mg | ORAL_TABLET | ORAL | Status: DC | PRN

## 2018-02-11 MED ORDER — SOD CITRATE-CITRIC ACID 500-334 MG/5ML PO SOLN: 30.0000 mL | ORAL | Status: DC | PRN

## 2018-02-11 MED ORDER — ZOLPIDEM TARTRATE 5 MG PO TABS: 5.0000 mg | ORAL_TABLET | Freq: Every evening | ORAL | Status: DC | PRN

## 2018-02-11 MED ORDER — BUTORPHANOL TARTRATE 1 MG/ML IJ SOLN: 1.0000 mg | INTRAMUSCULAR | Status: DC | PRN

## 2018-02-11 MED ORDER — TERBUTALINE SULFATE 1 MG/ML IJ SOLN
0.2500 mg | Freq: Once | INTRAMUSCULAR | Status: DC | PRN
  Filled 2018-02-11: qty 1

## 2018-02-11 MED ORDER — DIPHENHYDRAMINE HCL 50 MG/ML IJ SOLN: 12.5000 mg | INTRAMUSCULAR | Status: DC | PRN

## 2018-02-11 MED ORDER — LACTATED RINGERS IV SOLN
INTRAVENOUS | Status: DC
  Administered 2018-02-11: 06:00:00 via INTRAVENOUS

## 2018-02-11 MED ORDER — COCONUT OIL OIL
1.0000 "application " | TOPICAL_OIL | Status: DC | PRN
  Administered 2018-02-12: 1 via TOPICAL
  Filled 2018-02-11: qty 120

## 2018-02-11 NOTE — Progress Notes (Signed)
FHT cat one UCs q4-7 min D/W patient  Begin pitocin augmentation

## 2018-02-11 NOTE — MAU Note (Signed)
SROM at 0200.  No VB.  + FM.  2.5 cm in the office last Wednesday

## 2018-02-11 NOTE — Anesthesia Preprocedure Evaluation (Deleted)
Anesthesia Evaluation  Patient identified by MRN, date of birth, ID band Patient awake    Reviewed: Allergy & Precautions, NPO status , Patient's Chart, lab work & pertinent test results  Airway Mallampati: II  TM Distance: >3 FB Neck ROM: Full    Dental no notable dental hx.    Pulmonary neg pulmonary ROS,    Pulmonary exam normal breath sounds clear to auscultation       Cardiovascular negative cardio ROS Normal cardiovascular exam Rhythm:Regular Rate:Normal     Neuro/Psych negative neurological ROS     GI/Hepatic   Endo/Other    Renal/GU      Musculoskeletal   Abdominal   Peds  Hematology   Anesthesia Other Findings   Reproductive/Obstetrics (+) Pregnancy                             Anesthesia Physical Anesthesia Plan  ASA: II  Anesthesia Plan: Epidural   Post-op Pain Management:    Induction:   PONV Risk Score and Plan: Treatment may vary due to age or medical condition  Airway Management Planned:   Additional Equipment:   Intra-op Plan:   Post-operative Plan:   Informed Consent: I have reviewed the patients History and Physical, chart, labs and discussed the procedure including the risks, benefits and alternatives for the proposed anesthesia with the patient or authorized representative who has indicated his/her understanding and acceptance.     Plan Discussed with:   Anesthesia Plan Comments:         Lab Results  Component Value Date   WBC 6.9 02/11/2018   HGB 12.2 02/11/2018   HCT 36.4 02/11/2018   MCV 86.3 02/11/2018   PLT 185 02/11/2018    Anesthesia Quick Evaluation

## 2018-02-11 NOTE — H&P (Signed)
Shirley Hanson is a 37 y.o. female presenting for SROM feeling few UCs.  OB History    Gravida  2   Para  1   Term  1   Preterm      AB      Living  1     SAB      TAB      Ectopic      Multiple      Live Births  1          Past Medical History:  Diagnosis Date  . Basal cell carcinoma    Past Surgical History:  Procedure Laterality Date  . MOUTH SURGERY     Family History: family history includes Cancer in her father; Heart attack in her paternal grandfather; Hypertension in her father. Social History:  reports that she has never smoked. She has never used smokeless tobacco. She reports that she drinks alcohol. She reports that she does not use drugs.     Maternal Diabetes: No Genetic Screening: Normal Maternal Ultrasounds/Referrals: Normal Fetal Ultrasounds or other Referrals:  None Maternal Substance Abuse:  No Significant Maternal Medications:  None Significant Maternal Lab Results:  None Other Comments:  None  Review of Systems  Constitutional: Negative for fever.  Eyes: Negative for blurred vision.  Gastrointestinal: Negative for abdominal pain.  Neurological: Negative for headaches.   Maternal Medical History:  Reason for admission: Rupture of membranes.   Fetal activity: Perceived fetal activity is normal.      Dilation: 3.5 Effacement (%): 80 Station: -2 Exam by:: [employer], RN Blood pressure 131/88, pulse 97, temperature 97.8 F (36.6 C), temperature source Oral, resp. rate 17, weight 75 kg. Maternal Exam:  Abdomen: Patient reports no abdominal tenderness.   Fetal Exam Fetal State Assessment: Category I - tracings are normal.     Physical Exam  Cardiovascular: Normal rate and regular rhythm.  Respiratory: Effort normal and breath sounds normal.  GI: Soft. There is no tenderness.  Neurological: She has normal reflexes.    Prenatal labs: ABO, Rh:   Antibody:   Rubella:   RPR:    HBsAg:    HIV:    GBS:      Assessment/Plan: 37 yo G2P1  PROM Admit    Shirley Hanson 02/11/2018, 4:44 AM

## 2018-02-12 ENCOUNTER — Encounter (HOSPITAL_COMMUNITY): Payer: Self-pay | Admitting: *Deleted

## 2018-02-12 ENCOUNTER — Other Ambulatory Visit: Payer: Self-pay

## 2018-02-12 LAB — CBC
HCT: 35.5 % — ABNORMAL LOW (ref 36.0–46.0)
Hemoglobin: 11.7 g/dL — ABNORMAL LOW (ref 12.0–15.0)
MCH: 28.6 pg (ref 26.0–34.0)
MCHC: 33 g/dL (ref 30.0–36.0)
MCV: 86.8 fL (ref 78.0–100.0)
PLATELETS: 189 10*3/uL (ref 150–400)
RBC: 4.09 MIL/uL (ref 3.87–5.11)
RDW: 14 % (ref 11.5–15.5)
WBC: 9.3 10*3/uL (ref 4.0–10.5)

## 2018-02-12 LAB — BIRTH TISSUE RECOVERY COLLECTION (PLACENTA DONATION)

## 2018-02-12 MED ORDER — IBUPROFEN 600 MG PO TABS: 600.0000 mg | ORAL_TABLET | Freq: Four times a day (QID) | ORAL | 0 refills | Status: DC

## 2018-02-12 NOTE — Lactation Note (Signed)
This note was copied from a baby's chart. Lactation Consultation Note  Patient Name: Shirley Hanson [id number] Date: 02/12/2018 Reason for consult: Initial assessment;Early term 25-38.6wks  P2 mother whose infant is now 77 hours old.  Mother breast fed her first child.  Baby was sleeping in mother's arms when I entered.  She was not showing feeding cues.  Encouraged mother to feed 8-12 times/24 hours or sooner if baby shows cues.  Reviewed cues.  Mother is familiar with hand expression and will do this before/after feedings.  Colostrum container provided.  Mother's breasts are soft and non tender and nipples are everted bilaterally.  Mother stated her right side was sensitive with latching and I encouraged her to have an RN/LC observe the latch. She will call for latch assistance as  Needed.  Mother will return to work in December and has a DEBP for home use.  Family members present and supportive.   Maternal Data Formula Feeding for Exclusion: No Has patient been taught Hand Expression?: Yes Does the patient have breastfeeding experience prior to this delivery?: Yes  Feeding    LATCH Score                   Interventions    Lactation Tools Discussed/Used WIC Program: No   Consult Status Consult Status: Follow-up Date: 02/13/18 Follow-up type: In-patient    Gerda Yin R Yamari Ventola 02/12/2018, 11:32 AM

## 2018-02-12 NOTE — Discharge Summary (Signed)
Obstetric Discharge Summary Reason for Admission: onset of labor Prenatal Procedures: ultrasound Intrapartum Procedures: spontaneous vaginal delivery Postpartum Procedures: none Complications-Operative and Postpartum: none Hemoglobin  Date Value Ref Range Status  02/12/2018 11.7 (L) 12.0 - 15.0 g/dL Final   HCT  Date Value Ref Range Status  02/12/2018 35.5 (L) 36.0 - 46.0 % Final    Physical Exam:  General: alert and cooperative Lochia: appropriate Uterine Fundus: firm Incision: n/a DVT Evaluation: No evidence of DVT seen on physical exam.  Discharge Diagnoses: Term Pregnancy-delivered  Discharge Information: Date: 02/12/2018 Activity: pelvic rest Diet: routine Medications: PNV and Ibuprofen Condition: stable Instructions: refer to practice specific booklet Discharge to: home [place], [facility] Schedule an appointment as soon as possible for a visit in 6 week(s).   Contact information: [place] [id number] [phone]           Newborn Data: Live born female  Birth Weight: 7 lb 1.9 oz (3229 g) APGAR: 78, 9  Newborn Delivery   Birth date/time:  02/11/2018 08:46:00 Delivery type:  Vaginal, Spontaneous     Home with mother.  Marylynn Pearson 02/12/2018, 8:31 AM

## 2018-02-13 ENCOUNTER — Ambulatory Visit: Payer: Self-pay

## 2018-02-13 NOTE — Lactation Note (Signed)
This note was copied from a baby's chart. Lactation Consultation Note  Patient Name: Girl Alila Sotero [id number] Date: 02/13/2018 Reason for consult: Follow-up assessment;Early term 28-38.6wks  P2 mother whose infant is now 32 hours old  Mother has no questions/concerns related to breast feeding.  Nipples are sensitive so comfort gels provided with instructions for use.  Mother is also using EBM and coconut oil for lubrication.  Continue to feed 8-12 times/24 hours or sooner if baby shows feeding cues. Explained that baby may cluster feed tonight.  Mother remembers this from her first child.  She is familiar with hand expression and has no questions about this.  Engorgement prevention/treatment discussed.  Mother has a manual pump and a DEBP for home use.  She is ready for discharge.  OP number provided for questions after discharge.  Father present and supportive.    Maternal Data Formula Feeding for Exclusion: No Has patient been taught Hand Expression?: Yes Does the patient have breastfeeding experience prior to this delivery?: Yes  Feeding    LATCH Score                   Interventions    Lactation Tools Discussed/Used WIC Program: No   Consult Status Consult Status: Complete Date: 02/13/18 Follow-up type: Call as needed    Laketia Vicknair R Ostin Mathey 02/13/2018, 9:40 AM

## 2018-05-16 MED FILL — PROGESTERONE 200 MG CAPSULE: 200 | 7 days supply | Qty: 7 | Fill #0

## 2020-07-28 ENCOUNTER — Encounter: Payer: Self-pay | Admitting: Family Medicine

## 2020-07-28 ENCOUNTER — Emergency Department
Admission: EM | Admit: 2020-07-28 | Discharge: 2020-07-28 | Disposition: A | Discharge status: Home / Self Care | Payer: BC Managed Care – PPO | Source: Home / Self Care

## 2020-07-28 ENCOUNTER — Other Ambulatory Visit: Payer: Self-pay

## 2020-07-28 ENCOUNTER — Telehealth: Payer: BC Managed Care – PPO | Admitting: Orthopedic Surgery

## 2020-07-28 ENCOUNTER — Telehealth (INDEPENDENT_AMBULATORY_CARE_PROVIDER_SITE_OTHER): Payer: BC Managed Care – PPO | Admitting: Family Medicine

## 2020-07-28 ENCOUNTER — Encounter: Payer: Self-pay | Admitting: Emergency Medicine

## 2020-07-28 VITALS — Wt 140.0 lb

## 2020-07-28 DIAGNOSIS — T7840XA Allergy, unspecified, initial encounter: Secondary | ICD-10-CM

## 2020-07-28 DIAGNOSIS — J329 Chronic sinusitis, unspecified: Secondary | ICD-10-CM

## 2020-07-28 DIAGNOSIS — B9689 Other specified bacterial agents as the cause of diseases classified elsewhere: Secondary | ICD-10-CM | POA: Diagnosis not present

## 2020-07-28 MED ORDER — AMOXICILLIN 875 MG PO TABS: 875.0000 mg | ORAL_TABLET | Freq: Two times a day (BID) | ORAL | 0 refills | Status: DC

## 2020-07-28 MED ORDER — METHYLPREDNISOLONE SODIUM SUCC 125 MG IJ SOLR
125.0000 mg | Freq: Once | INTRAMUSCULAR | Status: AC
  Administered 2020-07-28: 125 mg via INTRAMUSCULAR

## 2020-07-28 NOTE — Progress Notes (Signed)
I connected with  Shirley Hanson on 07/28/20 by a video enabled telemedicine application and verified that I am speaking with the correct person using two identifiers.   I discussed the limitations of evaluation and management by telemedicine. The patient expressed understanding and agreed to proceed.

## 2020-07-28 NOTE — ED Triage Notes (Signed)
Patient was prescribed Amoxicillin today for a sinus infection, took her first dose @ 5:15pm, noticed she started feeling flush, redness all over.  Took Benadryl about 30 minutes ago.  No SOB.

## 2020-07-28 NOTE — Progress Notes (Signed)
   Virtual Visit via Video   I connected with patient on 07/28/20 at  2:30 PM EST by a video enabled telemedicine application and verified that I am speaking with the correct person using two identifiers.  Location patient: Home Location provider: [facility] Office Persons participating in the virtual visit: Patient, Provider, [place] (Sabrina M)  I discussed the limitations of evaluation and management by telemedicine and the availability of in person appointments. The patient expressed understanding and agreed to proceed.  Subjective:   HPI:   URI- 'it's just this pressure on my L side'.  Sxs started yesterday AM.  No fevers/chills/bodyaches.  + hoarseness.  Occasional cough.  + tooth pain on L upper jaw.  + sick contacts.  Pt has had COVID shots and booster.  Pt denies hx of sinus issues but did just move to new house surrounded by trees.  ROS:   See pertinent positives and negatives per HPI.  Patient Active Problem List   Diagnosis Date Noted  . PROM (premature rupture of membranes) 02/11/2018  . Sciatica of left side 06/17/2015  . Physical exam 05/19/2015  . Amniotic fluid leaking 03/27/2014    Social History   Tobacco Use  . Smoking status: Never Smoker  . Smokeless tobacco: Never Used  Substance Use Topics  . Alcohol use: Yes    Current Outpatient Medications:  .  levonorgestrel (MIRENA, 52 MG,) 20 MCG/24HR IUD, Mirena  inserted 06/07/2017, Disp: , Rfl:  .  calcium carbonate (TUMS - DOSED IN MG ELEMENTAL CALCIUM) 500 MG chewable tablet, Chew 2-3 tablets by mouth daily as needed for indigestion or heartburn (2-3 tablts depending on heartburn). (Patient not taking: Reported on 07/28/2020), Disp: , Rfl:  .  ibuprofen (ADVIL,MOTRIN) 600 MG tablet, Take 1 tablet (600 mg total) by mouth every 6 (six) hours. (Patient not taking: Reported on 07/28/2020), Disp: 30 tablet, Rfl: 0 .  Prenatal Vit-Fe Fumarate-FA (PRENATAL MULTIVITAMIN) TABS tablet, Take 1 tablet by mouth daily  at 12 noon. (Patient not taking: Reported on 07/28/2020), Disp: , Rfl:   No Known Allergies  Objective:   Wt 140 lb (63.5 kg)   BMI 23.30 kg/m  AAOx3, NAD NCAT, EOMI No obvious CN deficits Coloring WNL Pt is able to speak clearly, coherently without shortness of breath or increased work of breathing.  Thought process is linear.  Mood is appropriate.   Assessment and Plan:   Bacterial sinusitis- new.  Suspect this was triggered by untreated allergies that have now progressed to infxn.  Given her L sided facial pain, HA, tooth pain will start Amoxicillin.  Encouraged her to start daily antihistamine.  Reviewed supportive care and red flags that should prompt return.  Pt expressed understanding and is in agreement w/ plan.    Annye Asa, MD 07/28/2020

## 2020-07-28 NOTE — Discharge Instructions (Signed)
Take Benadryl at bedtime Take Zyrtec or Claritin during the daytime.  Double dose the daytime antihistamine Stay cool.  Heat will make you feel worse Go to the emergency room if you have trouble breathing or swelling in your mouth and throat

## 2020-07-28 NOTE — Progress Notes (Signed)
Pt did evisit for allergies but appears she was having allergic reaction to a medication she received today. I see that she has gone to the ED so will close this visit without charge.

## 2020-07-28 NOTE — ED Provider Notes (Signed)
[facility]    CSN: [id number] Arrival date & time: 07/28/20  1854      History   Chief Complaint Chief Complaint  Patient presents with  . Allergic Reaction    HPI Shirley Hanson is a 40 y.o. female.   HPI   Patient is here for an allergic reaction.  She states that she had a televisit today.  She spoke to her personal physician for dental pain and face pressure.  She was diagnosed with a sinus infection.  She was prescribed amoxicillin 875 to take twice a day.  She picked up the first dose after school and took it around 530.  30 minutes later she started feeling flushed.  She is here at the office with an allergic reaction, she is uniformly pink with a rash that is nearly body wide.  No swelling of the lips.  No change in voice.  No trouble breathing or swallowing.  Has never had an allergic reaction before although she thinks she has taken penicillin before  Past Medical History:  Diagnosis Date  . Basal cell carcinoma     Patient Active Problem List   Diagnosis Date Noted  . PROM (premature rupture of membranes) 02/11/2018  . Sciatica of left side 06/17/2015  . Physical exam 05/19/2015  . Amniotic fluid leaking 03/27/2014    Past Surgical History:  Procedure Laterality Date  . MOUTH SURGERY      OB History    Gravida  2   Para  2   Term  2   Preterm      AB      Living  2     SAB      IAB      Ectopic      Multiple  0   Live Births  2            Home Medications    Prior to Admission medications   Medication Sig Start Date End Date Taking? Authorizing Provider  calcium carbonate (TUMS - DOSED IN MG ELEMENTAL CALCIUM) 500 MG chewable tablet Chew 2-3 tablets by mouth daily as needed for indigestion or heartburn (2-3 tablts depending on heartburn).   Yes [provider]  levonorgestrel (MIRENA, 52 MG,) 20 MCG/24HR IUD Mirena  inserted 06/07/2017   Yes [provider]  ibuprofen (ADVIL,MOTRIN) 600 MG tablet Take  1 tablet (600 mg total) by mouth every 6 (six) hours. Patient not taking: Reported on 07/28/2020 02/12/18   Marylynn Pearson, MD  Prenatal Vit-Fe Fumarate-FA (PRENATAL MULTIVITAMIN) TABS tablet Take 1 tablet by mouth daily at 12 noon. Patient not taking: Reported on 07/28/2020    [provider]    Family History Family History  Problem Relation Age of Onset  . Cancer Father        skin CA  . Hypertension Father   . Heart attack Paternal Grandfather     Social History Social History   Tobacco Use  . Smoking status: Never Smoker  . Smokeless tobacco: Never Used  Vaping Use  . Vaping Use: Never used  Substance Use Topics  . Alcohol use: Yes  . Drug use: No     Allergies   Amoxicillin   Review of Systems Review of Systems See HPI  Physical Exam Triage Vital Signs ED Triage Vitals [07/28/20 1914]  Enc Vitals Group     BP 119/70     Pulse Rate 94     Resp 18     Temp  98.4 F (36.9 C)     Temp Source Oral     SpO2 95 %     Weight      Height      Head Circumference      Peak Flow      Pain Score 0     Pain Loc      Pain Edu?      Excl. in [place]?    No data found.  Updated Vital Signs BP 119/70 (BP Location: Left Arm)   Pulse 94   Temp 98.4 F (36.9 C) (Oral)   Resp 18   SpO2 95%      Physical Exam Constitutional:      General: She is not in acute distress.    Appearance: She is well-developed, normal weight and well-nourished.     Comments: Patient is alert.  Happy.  No acute distress.  HENT:     Head: Normocephalic and atraumatic.     Mouth/Throat:     Mouth: Oropharynx is clear and moist.     Comments: Oropharynx is benign Eyes:     Conjunctiva/sclera: Conjunctivae normal.     Pupils: Pupils are equal, round, and reactive to light.  Cardiovascular:     Rate and Rhythm: Normal rate and regular rhythm.  Pulmonary:     Effort: Pulmonary effort is normal. No respiratory distress.     Comments: Lungs are clear Abdominal:     General:  There is no distension.     Palpations: Abdomen is soft.  Musculoskeletal:        General: No edema. Normal range of motion.     Cervical back: Normal range of motion.  Skin:    General: Skin is warm and dry.     Comments: Skin is uniformly erythematous, and places it looks slightly swollen  Neurological:     Mental Status: She is alert.  Psychiatric:        Behavior: Behavior normal.      UC Treatments / Results  Labs (all labs ordered are listed, but only abnormal results are displayed) Labs Reviewed - No data to display  EKG   Radiology No results found.  Procedures Procedures (including critical care time)  Medications Ordered in UC Medications  methylPREDNISolone sodium succinate (SOLU-MEDROL) 125 mg/2 mL injection 125 mg (125 mg Intramuscular Given 07/28/20 1929)    Initial Impression / Assessment and Plan / UC Course  I have reviewed the triage vital signs and the nursing notes.  Pertinent labs & imaging results that were available during my care of the patient were reviewed by me and considered in my medical decision making (see chart for details).     Reviewed with patient allergy to amoxicillin Treated with Solu-Medrol At home follow-up with antihistamines Call personal physician tomorrow Go to ER if worse Final Clinical Impressions(s) / UC Diagnoses   Final diagnoses:  Allergic reaction to drug, initial encounter     Discharge Instructions     Take Benadryl at bedtime Take Zyrtec or Claritin during the daytime.  Double dose the daytime antihistamine Stay cool.  Heat will make you feel worse Go to the emergency room if you have trouble breathing or swelling in your mouth and throat   ED Prescriptions    None     PDMP not reviewed this encounter.   Raylene Everts, MD 07/28/20 [phone]

## 2020-11-23 ENCOUNTER — Encounter: Payer: Self-pay | Admitting: *Deleted

## 2021-03-20 ENCOUNTER — Encounter (HOSPITAL_BASED_OUTPATIENT_CLINIC_OR_DEPARTMENT_OTHER): Payer: Self-pay | Admitting: Pharmacist

## 2021-11-06 ENCOUNTER — Ambulatory Visit: Payer: BC Managed Care – PPO | Admitting: Family Medicine

## 2021-11-06 ENCOUNTER — Ambulatory Visit (INDEPENDENT_AMBULATORY_CARE_PROVIDER_SITE_OTHER): Payer: BC Managed Care – PPO | Admitting: Family Medicine

## 2021-11-06 ENCOUNTER — Encounter: Payer: Self-pay | Admitting: Family Medicine

## 2021-11-06 VITALS — BP 122/78 | HR 69 | Temp 98.6°F | Resp 16 | Ht 65.0 in | Wt 144.0 lb

## 2021-11-06 DIAGNOSIS — Z0000 Encounter for general adult medical examination without abnormal findings: Secondary | ICD-10-CM

## 2021-11-06 DIAGNOSIS — E559 Vitamin D deficiency, unspecified: Secondary | ICD-10-CM | POA: Diagnosis not present

## 2021-11-06 DIAGNOSIS — Z13220 Encounter for screening for lipoid disorders: Secondary | ICD-10-CM

## 2021-11-06 LAB — CBC WITH DIFFERENTIAL/PLATELET
Basophils Absolute: 0 10*3/uL (ref 0.0–0.1)
Basophils Relative: 0.6 % (ref 0.0–3.0)
Eosinophils Absolute: 0.1 10*3/uL (ref 0.0–0.7)
Eosinophils Relative: 1.2 % (ref 0.0–5.0)
HCT: 40.8 % (ref 36.0–46.0)
Hemoglobin: 13.7 g/dL (ref 12.0–15.0)
Lymphocytes Relative: 32.4 % (ref 12.0–46.0)
Lymphs Abs: 1.9 10*3/uL (ref 0.7–4.0)
MCHC: 33.5 g/dL (ref 30.0–36.0)
MCV: 88.3 fl (ref 78.0–100.0)
Monocytes Absolute: 0.5 10*3/uL (ref 0.1–1.0)
Monocytes Relative: 8 % (ref 3.0–12.0)
Neutro Abs: 3.4 10*3/uL (ref 1.4–7.7)
Neutrophils Relative %: 57.8 % (ref 43.0–77.0)
Platelets: 199 10*3/uL (ref 150.0–400.0)
RBC: 4.63 Mil/uL (ref 3.87–5.11)
RDW: 13.4 % (ref 11.5–15.5)
WBC: 5.8 10*3/uL (ref 4.0–10.5)

## 2021-11-06 LAB — BASIC METABOLIC PANEL
BUN: 12 mg/dL (ref 6–23)
CO2: 27 mEq/L (ref 19–32)
Calcium: 9.1 mg/dL (ref 8.4–10.5)
Chloride: 103 mEq/L (ref 96–112)
Creatinine, Ser: 0.69 mg/dL (ref 0.40–1.20)
GFR: 106.55 mL/min (ref >60.00)
Glucose, Bld: 108 mg/dL — ABNORMAL HIGH (ref 70–99)
Potassium: 4.1 mEq/L (ref 3.5–5.1)
Sodium: 137 mEq/L (ref 135–145)

## 2021-11-06 LAB — LIPID PANEL
Cholesterol: 134 mg/dL (ref 0–200)
HDL: 49.7 mg/dL (ref >39.00)
LDL Cholesterol: 70 mg/dL (ref 0–99)
NonHDL: 84.18
Total CHOL/HDL Ratio: 3
Triglycerides: 70 mg/dL (ref 0.0–149.0)
VLDL: 14 mg/dL (ref 0.0–40.0)

## 2021-11-06 LAB — HEPATIC FUNCTION PANEL
ALT: 15 U/L (ref 0–35)
AST: 16 U/L (ref 0–37)
Albumin: 4.2 g/dL (ref 3.5–5.2)
Alkaline Phosphatase: 62 U/L (ref 39–117)
Bilirubin, Direct: 0.2 mg/dL (ref 0.0–0.3)
Total Bilirubin: 0.9 mg/dL (ref 0.2–1.2)
Total Protein: 6.6 g/dL (ref 6.0–8.3)

## 2021-11-06 LAB — TSH: TSH: 1.11 u[IU]/mL (ref 0.35–5.50)

## 2021-11-06 LAB — VITAMIN D 25 HYDROXY (VIT D DEFICIENCY, FRACTURES): VITD: 38.08 ng/mL (ref 30.00–100.00)

## 2021-11-06 NOTE — Patient Instructions (Addendum)
Follow up in 1 year or as needed We'll notify you of your lab results and make any changes if needed Keep up the good work on healthy diet and regular exercise- you look great! Call with any questions or concerns Stay Safe!  Stay Healthy! Have a great trip!!!

## 2021-11-06 NOTE — Progress Notes (Signed)
   Subjective:    Patient ID: Shirley Hanson, female    DOB: [birth year], 41 y.o.   MRN: [id number]  HPI CPE- UTD on pap, mammo, Tdap.  Patient Care Team    Relationship Specialty Notifications Start End  Midge Minium, MD PCP - General Family Medicine  02/11/18   Everlene Farrier, MD Consulting Physician Obstetrics and Gynecology  05/19/15   Deirdre Pippins, PA-C Physician Assistant Internal Medicine  05/19/15     Health Maintenance  Topic Date Due   COVID-19 Vaccine (4 - Pfizer series) 11/22/2021 (Originally 05/24/2020)   INFLUENZA VACCINE  12/26/2021   TETANUS/TDAP  05/18/2022   PAP SMEAR-Modifier  08/09/2024   HIV Screening  Completed   HPV VACCINES  Aged Out   Hepatitis C Screening  Discontinued      Review of Systems Patient reports no vision/ hearing changes, adenopathy,fever, weight change,  persistant/recurrent hoarseness , swallowing issues, chest pain, palpitations, edema, persistant/recurrent cough, hemoptysis, dyspnea (rest/exertional/paroxysmal nocturnal), gastrointestinal bleeding (melena, rectal bleeding), abdominal pain, significant heartburn, bowel changes, GU symptoms (dysuria, hematuria, incontinence), Gyn symptoms (abnormal  bleeding, pain),  syncope, focal weakness, memory loss, numbness & tingling, skin/hair/nail changes, abnormal bruising or bleeding, anxiety, or depression.     Objective:   Physical Exam General Appearance:    Alert, cooperative, no distress, appears stated age  Head:    Normocephalic, without obvious abnormality, atraumatic  Eyes:    PERRL, conjunctiva/corneas clear, EOM's intact both eyes  Ears:    Normal TM's and external ear canals, both ears  Nose:   Nares normal, septum midline, mucosa normal, no drainage    or sinus tenderness  Throat:   Lips, mucosa, and tongue normal; teeth and gums normal  Neck:   Supple, symmetrical, trachea midline, no adenopathy;    Thyroid: no enlargement/tenderness/nodules  Back:     Symmetric, no  curvature, ROM normal, no CVA tenderness  Lungs:     Clear to auscultation bilaterally, respirations unlabored  Chest Wall:    No tenderness or deformity   Heart:    Regular rate and rhythm, S1 and S2 normal, no murmur, rub   or gallop  Breast Exam:    Deferred to GYN  Abdomen:     Soft, non-tender, bowel sounds active all four quadrants,    no masses, no organomegaly  Genitalia:    Deferred to GYN  Rectal:    Extremities:   Extremities normal, atraumatic, no cyanosis or edema  Pulses:   2+ and symmetric all extremities  Skin:   Skin color, texture, turgor normal, no rashes or lesions  Lymph nodes:   Cervical, supraclavicular, and axillary nodes normal  Neurologic:   CNII-XII intact, normal strength, sensation and reflexes    throughout          Assessment & Plan:

## 2021-11-06 NOTE — Assessment & Plan Note (Signed)
Pt's PE WNL.  UTD on pap, mammo, Tdap.  Check labs.  Anticipatory guidance provided.

## 2021-11-07 ENCOUNTER — Telehealth: Payer: Self-pay

## 2021-11-07 NOTE — Telephone Encounter (Signed)
-----   Message from Midge Minium, MD sent at 11/07/2021 12:38 PM EDT ----- Labs look great!  No changes at this time

## 2021-11-07 NOTE — Telephone Encounter (Signed)
Informed pt of lab results  

## 2022-11-28 ENCOUNTER — Other Ambulatory Visit (HOSPITAL_BASED_OUTPATIENT_CLINIC_OR_DEPARTMENT_OTHER): Payer: Self-pay

## 2022-11-28 MED ORDER — MUPIROCIN 2 % EX OINT
1.0000 | TOPICAL_OINTMENT | Freq: Three times a day (TID) | CUTANEOUS | 0 refills | Status: AC
  Filled 2022-11-28: qty 22, 8d supply, fill #0

## 2022-11-28 MED ORDER — DOXYCYCLINE HYCLATE 100 MG PO CAPS
100.0000 mg | ORAL_CAPSULE | Freq: Two times a day (BID) | ORAL | 0 refills | Status: AC
  Filled 2022-11-28: qty 20, 10d supply, fill #0

## 2023-01-03 ENCOUNTER — Other Ambulatory Visit (HOSPITAL_BASED_OUTPATIENT_CLINIC_OR_DEPARTMENT_OTHER): Payer: Self-pay

## 2023-01-04 ENCOUNTER — Other Ambulatory Visit (HOSPITAL_BASED_OUTPATIENT_CLINIC_OR_DEPARTMENT_OTHER): Payer: Self-pay

## 2023-12-03 ENCOUNTER — Other Ambulatory Visit (HOSPITAL_BASED_OUTPATIENT_CLINIC_OR_DEPARTMENT_OTHER): Payer: Self-pay

## 2023-12-03 MED ORDER — TRETINOIN 0.05 % EX CREA
TOPICAL_CREAM | CUTANEOUS | 11 refills | Status: AC
  Filled 2023-12-03 – 2023-12-04 (×4): qty 45, 30d supply, fill #0

## 2023-12-03 MED ORDER — METRONIDAZOLE 0.75 % EX CREA
TOPICAL_CREAM | CUTANEOUS | 11 refills | Status: AC
  Filled 2023-12-03: qty 45, 30d supply, fill #0

## 2023-12-04 ENCOUNTER — Other Ambulatory Visit: Payer: Self-pay

## 2023-12-04 ENCOUNTER — Other Ambulatory Visit (HOSPITAL_COMMUNITY): Payer: Self-pay

## 2023-12-04 ENCOUNTER — Other Ambulatory Visit (HOSPITAL_BASED_OUTPATIENT_CLINIC_OR_DEPARTMENT_OTHER): Payer: Self-pay

## 2023-12-05 ENCOUNTER — Other Ambulatory Visit (HOSPITAL_BASED_OUTPATIENT_CLINIC_OR_DEPARTMENT_OTHER): Payer: Self-pay
